# Patient Record
Sex: Female | Born: 1970 | Race: White | Hispanic: Yes | Marital: Married | Smoking: Never smoker
Health system: Southern US, Community
[De-identification: ages and names within clinical notes are randomized; demographics above are authoritative.]

## PROBLEM LIST (undated history)

## (undated) DIAGNOSIS — F419 Anxiety disorder, unspecified: Secondary | ICD-10-CM

## (undated) DIAGNOSIS — E7800 Pure hypercholesterolemia, unspecified: Secondary | ICD-10-CM

## (undated) DIAGNOSIS — R0602 Shortness of breath: Secondary | ICD-10-CM

## (undated) DIAGNOSIS — K219 Gastro-esophageal reflux disease without esophagitis: Secondary | ICD-10-CM

## (undated) DIAGNOSIS — D219 Benign neoplasm of connective and other soft tissue, unspecified: Secondary | ICD-10-CM

## (undated) DIAGNOSIS — J302 Other seasonal allergic rhinitis: Secondary | ICD-10-CM

## (undated) HISTORY — PX: TUBAL LIGATION: SHX77

## (undated) HISTORY — DX: Benign neoplasm of connective and other soft tissue, unspecified: D21.9

---

## 2009-07-07 ENCOUNTER — Ambulatory Visit: Payer: Self-pay | Admitting: Obstetrics and Gynecology

## 2009-07-07 LAB — CONVERTED CEMR LAB
Trich, Wet Prep: NONE SEEN
Yeast Wet Prep HPF POC: NONE SEEN

## 2009-07-09 ENCOUNTER — Ambulatory Visit (HOSPITAL_COMMUNITY): Admission: RE | Admit: 2009-07-09 | Discharge: 2009-07-09 | Payer: Self-pay | Admitting: Family Medicine

## 2009-08-18 ENCOUNTER — Ambulatory Visit: Payer: Self-pay | Admitting: Obstetrics & Gynecology

## 2009-09-24 ENCOUNTER — Ambulatory Visit (HOSPITAL_COMMUNITY): Admission: RE | Admit: 2009-09-24 | Discharge: 2009-09-24 | Payer: Self-pay | Admitting: Family Medicine

## 2011-03-04 LAB — POCT PREGNANCY, URINE: Preg Test, Ur: NEGATIVE

## 2011-04-11 NOTE — Group Therapy Note (Signed)
Lynn Robinson, Lynn Robinson NO.:  [id number]   MEDICAL RECORD NO.:  [id number]          PATIENT TYPE:  WOC   LOCATION:  [facility]                   FACILITY:  [facility]   PHYSICIAN:  Argentina Donovan, MD        DATE OF BIRTH:  01/20/1967   DATE OF SERVICE:                                  CLINIC NOTE   The patient is a 40 year old Spanish-speaking Hispanic female gravida 3,  para 3-0-0-3 with youngest child 75 years of age with a tubal ligation  following delivery of her last child.  She went in for a Pap smear at  [facility] and told them that she had had  some dark premenstrual spotting, abdominal bloating, and mid cycle, that  it radiated down and felt like her bottom was falling out, so she was  referred here and history taking she says that these symptoms have been  there for 16 years, although they have gotten worse in the last few  years.  She has increased pressure.  She will have a period, feel good  for few days and then the bloating starts and she has a terrible  bloating and pelvic pressure even when she sits down for about a week,  feels like everything is falling out.  She also has an increase in  discharge at that time.  On examination, the patient has a blood  pressure 105/69, her weight is 192, and she is 4 feet 9 inches tall.  The patient's abdomen is obese, soft, flat, and nontender.  No masses or  organomegaly, palpable.  The external genitalia is normal.  BUS was  within normal limits.  Vagina is clean and well rugated with a heavy  mucus discharge.  The cervix is slightly hypertrophied with a parous and  very slight erosion of the cervix at the introitus.  Pap smear done a  month ago was normal profoundly and bimanual pelvic examination, the  uterus is upper limits of normal.  Anterior of normal shape and  consistency.  The adnexa could not be well outlined, but there did not  appear to be any sign of any fullness and the  cul-de-sac was free.   IMPRESSION:  Midcycle bloating, probable hormonal in etiology, quite wet  prep was taken.  The patient will return for results in 2 weeks.           ______________________________  Argentina Donovan, MD     PR/MEDQ  D:  07/07/2009  T:  07/08/2009  Job:  [id number]

## 2011-05-14 ENCOUNTER — Other Ambulatory Visit: Payer: Self-pay | Admitting: Radiology

## 2011-10-11 ENCOUNTER — Encounter: Payer: Self-pay | Admitting: Obstetrics & Gynecology

## 2011-10-11 ENCOUNTER — Other Ambulatory Visit: Payer: Self-pay | Admitting: Obstetrics & Gynecology

## 2011-10-11 ENCOUNTER — Ambulatory Visit (INDEPENDENT_AMBULATORY_CARE_PROVIDER_SITE_OTHER): Payer: Self-pay | Admitting: Obstetrics & Gynecology

## 2011-10-11 VITALS — BP 140/87 | HR 77 | Temp 97.9°F | Ht 61.0 in | Wt 184.8 lb

## 2011-10-11 DIAGNOSIS — D259 Leiomyoma of uterus, unspecified: Secondary | ICD-10-CM

## 2011-10-11 DIAGNOSIS — N920 Excessive and frequent menstruation with regular cycle: Secondary | ICD-10-CM

## 2011-10-11 LAB — CBC
HCT: 36.7 % (ref 36.0–46.0)
MCH: 30.1 pg (ref 26.0–34.0)
MCV: 87 fL (ref 78.0–100.0)
Platelets: 292 10*3/uL (ref 150–400)
WBC: 9.5 10*3/uL (ref 4.0–10.5)

## 2011-10-11 LAB — POCT URINALYSIS DIP (DEVICE)
Bilirubin Urine: NEGATIVE
Glucose, UA: NEGATIVE mg/dL
Hgb urine dipstick: NEGATIVE
Specific Gravity, Urine: 1.025 (ref 1.005–1.030)
Urobilinogen, UA: 0.2 mg/dL (ref 0.0–1.0)
pH: 5.5 (ref 5.0–8.0)

## 2011-10-11 NOTE — Progress Notes (Signed)
Completed mammogram schloarship form with patient per patient request and sent to radiology

## 2011-10-11 NOTE — Progress Notes (Signed)
  Subjective:    Patient ID: Lynn Robinson, female    DOB: 01/20/1967, 40 y.o.   MRN: [id number]  HPI Patient's last menstrual period was 09/28/2011. She has a diagnosis of uterine fibroids since 2010. She has increasing pelvic pressure, pain, and feels a mass in her left lower quadrant. She potts for a few days before menses, then bleeds 3-4 days, sometimes heavily. Cramps are present.  Past Medical History  Diagnosis Date  . Fibroids    Past Surgical History  Procedure Date  . Tubal ligation    Scheduled Meds:   Continuous Infusions:   PRN Meds:.   No Known Allergies History   Social History  . Marital Status: Married    Spouse Name: N/A    Number of Children: N/A  . Years of Education: N/A   Occupational History  . Not on file.   Social History Main Topics  . Smoking status: Never Smoker   . Smokeless tobacco: Never Used  . Alcohol Use: No  . Drug Use: No  . Sexually Active: Yes    Birth Control/ Protection: Surgical   Other Topics Concern  . Not on file   Social History Narrative  . No narrative on file   Lynn Robinson does not currently have medications on file.    Review of Systems  Constitutional: Negative.   Respiratory: Negative.   Gastrointestinal: Negative.   Genitourinary: Positive for dysuria, urgency and pelvic pain.       Objective:   Physical Exam  Constitutional: She appears well-developed. No distress.  Neck: Neck supple.  Abdominal: Soft. Normal appearance. She exhibits mass. There is no tenderness.  Genitourinary: Vagina normal. No breast swelling, tenderness or bleeding. Uterus is enlarged. Uterus is not tender. Cervix exhibits no motion tenderness and no discharge. Right adnexum displays no mass. Left adnexum displays no mass.   Uterus firm, 12 weeks size       Assessment & Plan:  Fibroid, pressure, menorrhagia Pelvic US, UA, CBC, Pap done RTC 2 weeks

## 2011-10-11 NOTE — Patient Instructions (Signed)
Fibromas (Fibroids) Le han diagnosticado un fibroma. Los fibromas son tumores (bultos) de tejido muscular blando que pueden aparecer en cualquiel lugar del organismo de [place]. Generalmente se desarrollan en el tero (matriz). El sntoma (problema) ms comn de los fibromas es la hemorragia. Con el tiempo puede causar anemia (poca cantidad de glbulos rojos). Otros sntomas incluyen sensaciones de opresin y dolor en la pelvis. El diagnstico (determinar cul es el problema) se realiza a travs del examen fsico. A veces se utilizan pruebas como el [place]. Este examen es de gran utilidad cuando los fibromas se localizan alrededor de los ovarios y tambin para Engineer, manufacturing tumores. TRATAMIENTO  La mayor parte de los fibromas no necesitan tratamiento mdico o quirrgico. A veces es necesario realizar una biopsia (tomar muestras de tejido) de las paredes del tero para Clinical cytogeneticist. Si se ha descartado cncer y slo hay una pequea cantidad de hemorragia, el problema debe mantenerse bajo observacin.   Un tratamiento hormonal puede mejorar el problema.   Cuando se hace necesario implementar una ciruga, sta consistir en la extirpacin del fibroma. Despus de la extirpacin de fibromas, no ser posible tener un parto por va vaginal. Todo depende de la ubicacin y la extensin de la [place]. Cuando se produce Chartered loss adjuster en presencia de un fibroma, generalmente es normal.   El profesional que lo asiste ayudar a Chief Strategy Officer que tratamiento es el mejor para usted.  INSTRUCCIONES PARA EL CUIDADO DOMICILIARIO  Utilice los medicamentos de venta libre o de prescripcin para Chief Technology Officer, el malestar o la [place], segn se lo indique el profesional que lo asiste. No utilice aspirina, ya que puede Metallurgist.   Si las menstruaciones (perodos) son muy abundantes, registre el nmero de apsitos o tampones que utiliza por mes. Lleve esta informacin al profesional que la asiste. Esto puede ayudar  a Leisure centre manager tratamiento para usted.  SOLICITE ATENCIN MDICA DE INMEDIATO SI:  Siente dolor o calambres en la pelvis que no puede controlar con los medicamentos, o experimenta un repentino aumento del Engineer, mining.   La hemorragia plvica [employer] o durante el transcurso de las mismas.   Si se siente mareada o se desmaya.   Presenta dolor abdominal (en el vientre) que se hace cada vez ms intenso.  Document Released: 11/13/2005 Document Revised: 07/26/2011 [facility] Patient Information 2012 [place], [place].

## 2011-10-13 ENCOUNTER — Ambulatory Visit (HOSPITAL_COMMUNITY)
Admission: RE | Admit: 2011-10-13 | Discharge: 2011-10-13 | Disposition: A | Discharge status: Home / Self Care | Payer: Self-pay | Source: Ambulatory Visit | Attending: Obstetrics & Gynecology | Admitting: Obstetrics & Gynecology

## 2011-10-13 DIAGNOSIS — N852 Hypertrophy of uterus: Secondary | ICD-10-CM | POA: Insufficient documentation

## 2011-10-13 DIAGNOSIS — D259 Leiomyoma of uterus, unspecified: Secondary | ICD-10-CM

## 2011-10-13 DIAGNOSIS — N920 Excessive and frequent menstruation with regular cycle: Secondary | ICD-10-CM

## 2011-10-13 DIAGNOSIS — N949 Unspecified condition associated with female genital organs and menstrual cycle: Secondary | ICD-10-CM | POA: Insufficient documentation

## 2011-10-16 ENCOUNTER — Other Ambulatory Visit: Payer: Self-pay | Admitting: Obstetrics & Gynecology

## 2011-10-16 DIAGNOSIS — N63 Unspecified lump in unspecified breast: Secondary | ICD-10-CM

## 2011-11-15 ENCOUNTER — Ambulatory Visit (INDEPENDENT_AMBULATORY_CARE_PROVIDER_SITE_OTHER): Payer: Self-pay | Admitting: Obstetrics and Gynecology

## 2011-11-15 ENCOUNTER — Encounter: Payer: Self-pay | Admitting: Obstetrics and Gynecology

## 2011-11-15 DIAGNOSIS — N920 Excessive and frequent menstruation with regular cycle: Secondary | ICD-10-CM

## 2011-11-15 DIAGNOSIS — D259 Leiomyoma of uterus, unspecified: Secondary | ICD-10-CM

## 2011-11-15 MED ORDER — OXYCODONE-ACETAMINOPHEN 5-325 MG PO TABS: 1.0000 | ORAL_TABLET | ORAL | Status: DC | PRN

## 2011-11-15 NOTE — Progress Notes (Signed)
Patient presents today to discuss the results of the ultrasound and for further management of her fibroids. Patient reports regular monthly menses with heavy flow but her most bothersome symptoms are that of pelvic pain. Patient reports that it is hard for her to find a comfortable position to sleep secondary to the pain. Patient with 16-18 week size uterus on exam and was counseled on TAH. Risk, benefits and alternatives were explained including but not limited to risk of bleeding, infection and damage to adjacent organs. Patient verbalized understanding and will be contacted by surgical scheduler with date and time of the surgery.

## 2011-11-15 NOTE — Progress Notes (Signed)
Pt. States had flu shot this year at [facility] [facility]

## 2011-11-17 ENCOUNTER — Encounter (HOSPITAL_COMMUNITY): Payer: Self-pay | Admitting: Pharmacist

## 2011-11-24 ENCOUNTER — Telehealth: Payer: Self-pay | Admitting: *Deleted

## 2011-11-24 NOTE — Telephone Encounter (Signed)
I called the Patient Accounting Dept. about the pt's financial aid application status as requested last week by pt and her daughter. After learning of the current status, I called and  spoke with pt's daughter [name]) earlier today in reference to pt's financial aid application. I explained that there is some additional information needed.  It is necessary to have a recent bank statement from all of the people in the household that are working. This information will complete the application so that it can be processed. [name] stated that she will bring the information by 11/29/11. I informed [name] that since her mother is scheduled for surgery on 12/06/11, it is very important to get this information quickly. She voiced understanding. I notified Karna Christmas @ the pt accounting dept that I had spoken with Ms [name] family member. She will forward the application to the appropriate personnel once the statements have been received.

## 2011-11-30 ENCOUNTER — Encounter (HOSPITAL_COMMUNITY): Payer: Self-pay

## 2011-11-30 ENCOUNTER — Encounter (HOSPITAL_COMMUNITY)
Admission: RE | Admit: 2011-11-30 | Discharge: 2011-11-30 | Disposition: A | Discharge status: Home / Self Care | Payer: Self-pay | Source: Ambulatory Visit | Attending: Obstetrics and Gynecology | Admitting: Obstetrics and Gynecology

## 2011-11-30 HISTORY — DX: Anxiety disorder, unspecified: F41.9

## 2011-11-30 HISTORY — DX: Other seasonal allergic rhinitis: J30.2

## 2011-11-30 HISTORY — DX: Gastro-esophageal reflux disease without esophagitis: K21.9

## 2011-11-30 HISTORY — DX: Shortness of breath: R06.02

## 2011-11-30 LAB — SURGICAL PCR SCREEN: Staphylococcus aureus: NEGATIVE

## 2011-11-30 LAB — CBC: MCV: 88 fL (ref 78.0–100.0)

## 2011-11-30 NOTE — Patient Instructions (Addendum)
   Your procedure is scheduled on:  Wednesday January 9th  Enter through the [employer] of [facility] at: 11:30am Pick up the phone at the desk and dial [phone] and inform us of your arrival.  Please call this number if you have any problems the morning of surgery: [phone]  Remember: Do not eat food after midnight: Tuesday Do not drink clear liquids after:Wednesday at 9am Take these medicines the morning of surgery with a SIP OF WATER: none  Do not wear jewelry, make-up, or FINGER nail polish Do not wear lotions, powders, perfumes or deodorant. Do not shave 48 hours prior to surgery. Do not bring valuables to the hospital.  Leave suitcase in the car. After Surgery it may be brought to your room. For patients being admitted to the hospital, checkout time is 11:00am the day of discharge.  Patients discharged on the day of surgery will not be allowed to drive home.     Remember to use your hibiclens as instructed.Please shower with 1/2 bottle the evening before your surgery and the other 1/2 bottle the morning of surgery.

## 2011-11-30 NOTE — Pre-Procedure Instructions (Signed)
Hospital interpreter assisted with interview

## 2011-12-06 ENCOUNTER — Encounter (HOSPITAL_COMMUNITY)
Admission: RE | Disposition: A | Discharge status: Home / Self Care | Payer: Self-pay | Source: Ambulatory Visit | Attending: Obstetrics and Gynecology

## 2011-12-06 ENCOUNTER — Inpatient Hospital Stay (HOSPITAL_COMMUNITY): Payer: Self-pay | Admitting: Anesthesiology

## 2011-12-06 ENCOUNTER — Encounter (HOSPITAL_COMMUNITY): Payer: Self-pay | Admitting: Anesthesiology

## 2011-12-06 ENCOUNTER — Inpatient Hospital Stay (HOSPITAL_COMMUNITY)
Admission: RE | Admit: 2011-12-06 | Discharge: 2011-12-08 | DRG: 743 | Disposition: A | Discharge status: Home / Self Care | Payer: Self-pay | Source: Ambulatory Visit | Attending: Obstetrics and Gynecology | Admitting: Obstetrics and Gynecology

## 2011-12-06 ENCOUNTER — Other Ambulatory Visit: Payer: Self-pay | Admitting: Obstetrics and Gynecology

## 2011-12-06 ENCOUNTER — Encounter (HOSPITAL_COMMUNITY): Payer: Self-pay | Admitting: *Deleted

## 2011-12-06 DIAGNOSIS — N938 Other specified abnormal uterine and vaginal bleeding: Secondary | ICD-10-CM

## 2011-12-06 DIAGNOSIS — N8000 Endometriosis of the uterus, unspecified: Secondary | ICD-10-CM | POA: Diagnosis present

## 2011-12-06 DIAGNOSIS — N949 Unspecified condition associated with female genital organs and menstrual cycle: Secondary | ICD-10-CM | POA: Diagnosis present

## 2011-12-06 DIAGNOSIS — D259 Leiomyoma of uterus, unspecified: Secondary | ICD-10-CM

## 2011-12-06 DIAGNOSIS — D251 Intramural leiomyoma of uterus: Principal | ICD-10-CM | POA: Diagnosis present

## 2011-12-06 DIAGNOSIS — R1032 Left lower quadrant pain: Secondary | ICD-10-CM | POA: Diagnosis present

## 2011-12-06 DIAGNOSIS — N920 Excessive and frequent menstruation with regular cycle: Secondary | ICD-10-CM

## 2011-12-06 HISTORY — PX: CYSTOSCOPY: SHX5120

## 2011-12-06 HISTORY — PX: ABDOMINAL HYSTERECTOMY: SHX81

## 2011-12-06 SURGERY — HYSTERECTOMY, ABDOMINAL
Anesthesia: General | Site: Bladder | Wound class: Clean Contaminated

## 2011-12-06 MED ORDER — CEFAZOLIN SODIUM 1-5 GM-% IV SOLN: 1.0000 g | INTRAVENOUS | Status: DC

## 2011-12-06 MED ORDER — PROPOFOL 10 MG/ML IV EMUL
INTRAVENOUS | Status: DC | PRN
  Administered 2011-12-06: 200 mg via INTRAVENOUS

## 2011-12-06 MED ORDER — ONDANSETRON HCL 4 MG/2ML IJ SOLN
INTRAMUSCULAR | Status: AC
  Filled 2011-12-06: qty 2

## 2011-12-06 MED ORDER — MIDAZOLAM HCL 2 MG/2ML IJ SOLN
INTRAMUSCULAR | Status: AC
  Filled 2011-12-06: qty 2

## 2011-12-06 MED ORDER — CEFAZOLIN SODIUM 1-5 GM-% IV SOLN
INTRAVENOUS | Status: AC
  Administered 2011-12-06: 1 g via INTRAVENOUS
  Filled 2011-12-06: qty 50

## 2011-12-06 MED ORDER — DIPHENHYDRAMINE HCL 50 MG/ML IJ SOLN: 12.5000 mg | Freq: Four times a day (QID) | INTRAMUSCULAR | Status: DC | PRN

## 2011-12-06 MED ORDER — ROCURONIUM BROMIDE 50 MG/5ML IV SOLN
INTRAVENOUS | Status: AC
  Filled 2011-12-06: qty 1

## 2011-12-06 MED ORDER — LIDOCAINE HCL (CARDIAC) 20 MG/ML IV SOLN
INTRAVENOUS | Status: AC
  Filled 2011-12-06: qty 5

## 2011-12-06 MED ORDER — 0.9 % SODIUM CHLORIDE (POUR BTL) OPTIME
TOPICAL | Status: DC | PRN
  Administered 2011-12-06: 1000 mL

## 2011-12-06 MED ORDER — HYDROMORPHONE 0.3 MG/ML IV SOLN
INTRAVENOUS | Status: AC
  Filled 2011-12-06: qty 25

## 2011-12-06 MED ORDER — SODIUM CHLORIDE 0.9 % IJ SOLN: 9.0000 mL | INTRAMUSCULAR | Status: DC | PRN

## 2011-12-06 MED ORDER — FENTANYL CITRATE 0.05 MG/ML IJ SOLN
INTRAMUSCULAR | Status: AC
  Filled 2011-12-06: qty 5

## 2011-12-06 MED ORDER — KETOROLAC TROMETHAMINE 30 MG/ML IJ SOLN
INTRAMUSCULAR | Status: AC
  Filled 2011-12-06: qty 1

## 2011-12-06 MED ORDER — FENTANYL CITRATE 0.05 MG/ML IJ SOLN
INTRAMUSCULAR | Status: DC | PRN
  Administered 2011-12-06 (×4): 50 ug via INTRAVENOUS
  Administered 2011-12-06: 100 ug via INTRAVENOUS

## 2011-12-06 MED ORDER — LACTATED RINGERS IV SOLN
INTRAVENOUS | Status: DC
  Administered 2011-12-06: 125 mL/h via INTRAVENOUS
  Administered 2011-12-06 (×2): via INTRAVENOUS

## 2011-12-06 MED ORDER — LACTATED RINGERS IV SOLN
INTRAVENOUS | Status: DC
  Administered 2011-12-06 – 2011-12-07 (×3): via INTRAVENOUS

## 2011-12-06 MED ORDER — ESMOLOL HCL 10 MG/ML IV SOLN
INTRAVENOUS | Status: AC
  Filled 2011-12-06: qty 10

## 2011-12-06 MED ORDER — GLYCOPYRROLATE 0.2 MG/ML IJ SOLN
INTRAMUSCULAR | Status: AC
  Filled 2011-12-06: qty 1

## 2011-12-06 MED ORDER — NALOXONE HCL 0.4 MG/ML IJ SOLN: 0.4000 mg | INTRAMUSCULAR | Status: DC | PRN

## 2011-12-06 MED ORDER — GLYCOPYRROLATE 0.2 MG/ML IJ SOLN
INTRAMUSCULAR | Status: DC | PRN
  Administered 2011-12-06: .6 mg via INTRAVENOUS

## 2011-12-06 MED ORDER — ONDANSETRON HCL 4 MG/2ML IJ SOLN: 4.0000 mg | Freq: Four times a day (QID) | INTRAMUSCULAR | Status: DC | PRN

## 2011-12-06 MED ORDER — DEXAMETHASONE SODIUM PHOSPHATE 10 MG/ML IJ SOLN
INTRAMUSCULAR | Status: AC
  Filled 2011-12-06: qty 1

## 2011-12-06 MED ORDER — LIDOCAINE HCL (CARDIAC) 20 MG/ML IV SOLN
INTRAVENOUS | Status: DC | PRN
  Administered 2011-12-06: 100 mg via INTRAVENOUS

## 2011-12-06 MED ORDER — INDIGOTINDISULFONATE SODIUM 8 MG/ML IJ SOLN
INTRAMUSCULAR | Status: AC
  Filled 2011-12-06: qty 5

## 2011-12-06 MED ORDER — ONDANSETRON HCL 4 MG/2ML IJ SOLN
INTRAMUSCULAR | Status: DC | PRN
  Administered 2011-12-06: 4 mg via INTRAVENOUS

## 2011-12-06 MED ORDER — NEOSTIGMINE METHYLSULFATE 1 MG/ML IJ SOLN
INTRAMUSCULAR | Status: DC | PRN
  Administered 2011-12-06: 3 mg via INTRAVENOUS

## 2011-12-06 MED ORDER — ROCURONIUM BROMIDE 100 MG/10ML IV SOLN
INTRAVENOUS | Status: DC | PRN
  Administered 2011-12-06: 50 mg via INTRAVENOUS

## 2011-12-06 MED ORDER — PROMETHAZINE HCL 25 MG/ML IJ SOLN: 12.5000 mg | INTRAMUSCULAR | Status: DC | PRN

## 2011-12-06 MED ORDER — PROPOFOL 10 MG/ML IV EMUL
INTRAVENOUS | Status: AC
  Filled 2011-12-06: qty 20

## 2011-12-06 MED ORDER — HYDROMORPHONE 0.3 MG/ML IV SOLN
INTRAVENOUS | Status: DC
  Administered 2011-12-06: 1.8 mg via INTRAVENOUS
  Administered 2011-12-06: 17:00:00 via INTRAVENOUS
  Administered 2011-12-07: 0.6 mg via INTRAVENOUS
  Administered 2011-12-07: 0.9 mg via INTRAVENOUS

## 2011-12-06 MED ORDER — HYDROMORPHONE HCL PF 1 MG/ML IJ SOLN
INTRAMUSCULAR | Status: AC
  Administered 2011-12-06: 0.5 mg via INTRAVENOUS
  Filled 2011-12-06: qty 1

## 2011-12-06 MED ORDER — ESMOLOL HCL 10 MG/ML IV SOLN
INTRAVENOUS | Status: DC | PRN
  Administered 2011-12-06: 10 mg via INTRAVENOUS

## 2011-12-06 MED ORDER — KETOROLAC TROMETHAMINE 30 MG/ML IJ SOLN
INTRAMUSCULAR | Status: DC | PRN
  Administered 2011-12-06: 30 mg via INTRAVENOUS

## 2011-12-06 MED ORDER — METOCLOPRAMIDE HCL 5 MG/ML IJ SOLN: 10.0000 mg | Freq: Once | INTRAMUSCULAR | Status: DC | PRN

## 2011-12-06 MED ORDER — DEXAMETHASONE SODIUM PHOSPHATE 4 MG/ML IJ SOLN
INTRAMUSCULAR | Status: DC | PRN
  Administered 2011-12-06: 10 mg via INTRAVENOUS

## 2011-12-06 MED ORDER — HYDROMORPHONE HCL PF 1 MG/ML IJ SOLN
0.2500 mg | INTRAMUSCULAR | Status: DC | PRN
  Administered 2011-12-06: 0.5 mg via INTRAVENOUS

## 2011-12-06 MED ORDER — LACTATED RINGERS IV SOLN: INTRAVENOUS | Status: DC

## 2011-12-06 MED ORDER — DIPHENHYDRAMINE HCL 12.5 MG/5ML PO ELIX: 12.5000 mg | ORAL_SOLUTION | Freq: Four times a day (QID) | ORAL | Status: DC | PRN

## 2011-12-06 MED ORDER — NEOSTIGMINE METHYLSULFATE 1 MG/ML IJ SOLN
INTRAMUSCULAR | Status: AC
  Filled 2011-12-06: qty 10

## 2011-12-06 MED ORDER — FENTANYL CITRATE 0.05 MG/ML IJ SOLN
INTRAMUSCULAR | Status: AC
  Filled 2011-12-06: qty 2

## 2011-12-06 MED ORDER — INDIGOTINDISULFONATE SODIUM 8 MG/ML IJ SOLN
INTRAMUSCULAR | Status: DC | PRN
  Administered 2011-12-06: 40 mg via INTRAVENOUS

## 2011-12-06 MED ORDER — MIDAZOLAM HCL 5 MG/5ML IJ SOLN
INTRAMUSCULAR | Status: DC | PRN
  Administered 2011-12-06 (×2): 1 mg via INTRAVENOUS

## 2011-12-06 SURGICAL SUPPLY — 32 items
CANISTER SUCTION 2500CC (MISCELLANEOUS) ×3 IMPLANT
CHLORAPREP W/TINT 26ML (MISCELLANEOUS) ×3 IMPLANT
CLOTH BEACON ORANGE TIMEOUT ST (SAFETY) ×3 IMPLANT
CONT PATH 16OZ SNAP LID 3702 (MISCELLANEOUS) ×3 IMPLANT
DECANTER SPIKE VIAL GLASS SM (MISCELLANEOUS) IMPLANT
DRESSING TELFA 8X3 (GAUZE/BANDAGES/DRESSINGS) ×3 IMPLANT
DRSG PAD ABDOMINAL 8X10 ST (GAUZE/BANDAGES/DRESSINGS) ×3 IMPLANT
GAUZE SPONGE 4X4 12PLY STRL LF (GAUZE/BANDAGES/DRESSINGS) ×3 IMPLANT
GLOVE BIOGEL PI IND STRL 6.5 (GLOVE) ×4 IMPLANT
GLOVE BIOGEL PI INDICATOR 6.5 (GLOVE) ×2
GLOVE SURG SS PI 6.0 STRL IVOR (GLOVE) ×3 IMPLANT
GOWN PREVENTION PLUS LG XLONG (DISPOSABLE) ×9 IMPLANT
NEEDLE HYPO 25X1 1.5 SAFETY (NEEDLE) IMPLANT
NS IRRIG 1000ML POUR BTL (IV SOLUTION) ×3 IMPLANT
PACK ABDOMINAL GYN (CUSTOM PROCEDURE TRAY) ×3 IMPLANT
PAD OB MATERNITY 4.3X12.25 (PERSONAL CARE ITEMS) ×3 IMPLANT
SEPRAFILM MEMBRANE 5X6 (MISCELLANEOUS) IMPLANT
SET CYSTO W/LG BORE CLAMP LF (SET/KITS/TRAYS/PACK) ×3 IMPLANT
SPONGE LAP 18X18 X RAY DECT (DISPOSABLE) ×3 IMPLANT
STAPLER VISISTAT 35W (STAPLE) ×3 IMPLANT
SUT PDS AB 1 CT1 36 (SUTURE) IMPLANT
SUT VIC AB 0 CT1 18XCR BRD8 (SUTURE) ×4 IMPLANT
SUT VIC AB 0 CT1 36 (SUTURE) ×6 IMPLANT
SUT VIC AB 0 CT1 8-18 (SUTURE) ×2
SUT VIC AB 3-0 FS2 27 (SUTURE) ×3 IMPLANT
SUT VICRYL 0 TIES 12 18 (SUTURE) ×3 IMPLANT
SYR CONTROL 10ML LL (SYRINGE) IMPLANT
TAPE CLOTH SURG 4X10 WHT LF (GAUZE/BANDAGES/DRESSINGS) ×3 IMPLANT
TOWEL OR 17X24 6PK STRL BLUE (TOWEL DISPOSABLE) ×6 IMPLANT
TRAY FOLEY CATH 14FR (SET/KITS/TRAYS/PACK) ×3 IMPLANT
WATER STERILE IRR 1000ML POUR (IV SOLUTION) IMPLANT
WATER STERILE IRR 1000ML UROMA (IV SOLUTION) ×3 IMPLANT

## 2011-12-06 NOTE — Anesthesia Preprocedure Evaluation (Signed)
Anesthesia Evaluation  Patient identified by MRN, date of birth, ID band Patient awake    Reviewed: Allergy & Precautions, H&P , NPO status , Patient's Chart, lab work & pertinent test results, reviewed documented beta blocker date and time   Airway Mallampati: II TM Distance: >3 FB Neck ROM: full    Dental No notable dental hx. (+) Teeth Intact   Pulmonary    Pulmonary exam normal       Cardiovascular neg cardio ROS     Neuro/Psych Negative Neurological ROS  Negative Psych ROS   GI/Hepatic Neg liver ROS, GERD-  ,  Endo/Other  Morbid obesity  Renal/GU negative Renal ROS  Genitourinary negative   Musculoskeletal negative musculoskeletal ROS (+)   Abdominal (+) obese,   Peds negative pediatric ROS (+)  Hematology negative hematology ROS (+)   Anesthesia Other Findings   Reproductive/Obstetrics negative OB ROS                           Anesthesia Physical Anesthesia Plan  ASA: III  Anesthesia Plan: General   Post-op Pain Management:    Induction: Intravenous  Airway Management Planned: Oral ETT  Additional Equipment:   Intra-op Plan:   Post-operative Plan: Extubation in OR  Informed Consent: I have reviewed the patients History and Physical, chart, labs and discussed the procedure including the risks, benefits and alternatives for the proposed anesthesia with the patient or authorized representative who has indicated his/her understanding and acceptance.   Dental Advisory Given  Plan Discussed with: CRNA  Anesthesia Plan Comments:         Anesthesia Quick Evaluation

## 2011-12-06 NOTE — Anesthesia Postprocedure Evaluation (Signed)
Anesthesia Post Note  Patient: Lynn Robinson  Procedure(s) Performed:  HYSTERECTOMY ABDOMINAL; CYSTOSCOPY  Anesthesia type: General  Patient location: PACU  Post pain: Pain level controlled  Post assessment: Post-op Vital signs reviewed  Last Vitals:  Filed Vitals:   12/06/11 1443  BP: 124/63  Pulse: 92  Temp: 37.2 C  Resp: 16    Post vital signs: Reviewed  Level of consciousness: sedated  Complications: No apparent anesthesia complicationsfj

## 2011-12-06 NOTE — Anesthesia Procedure Notes (Signed)
Procedure Name: Intubation Date/Time: 12/06/2011 1:05 PM Performed by: Truitt Leep Pre-anesthesia Checklist: Patient identified, Emergency Drugs available, Suction available, Patient being monitored and Timeout performed Patient Re-evaluated:Patient Re-evaluated prior to inductionOxygen Delivery Method: Circle System Utilized Preoxygenation: Pre-oxygenation with 100% oxygen Intubation Type: IV induction Ventilation: Mask ventilation without difficulty Laryngoscope Size: Mac and 4 Grade View: Grade I Tube type: Oral Number of attempts: 1 Placement Confirmation: ETT inserted through vocal cords under direct vision,  positive ETCO2 and breath sounds checked- equal and bilateral Secured at: 22 cm Dental Injury: Teeth and Oropharynx as per pre-operative assessment

## 2011-12-06 NOTE — Transfer of Care (Signed)
Immediate Anesthesia Transfer of Care Note  Patient: Lynn Robinson  Procedure(s) Performed:  HYSTERECTOMY ABDOMINAL; CYSTOSCOPY  Patient Location: PACU  Anesthesia Type: General  Level of Consciousness: awake and sedated  Airway & Oxygen Therapy: Patient connected to nasal cannula oxygen  Post-op Assessment: Post -op Vital signs reviewed and stable  Post vital signs: Reviewed and stable  Complications: No apparent anesthesia complications

## 2011-12-06 NOTE — H&P (Addendum)
Lynn Robinson is an 41 y.o. female G3P3 presenting today for scheduled hysterectomy for treatment of fibroid uterus. Patient has been diagnosed with fibroid uterus since 2010 and has experienced monthly menses lasting 4 days with heavy flow. Her most bothersome symptom is that of pelvic pressure mainly localized in her LLQ.   Pertinent Gynecological History: Bleeding: regular Contraception: tubal ligation DES exposure: denies Blood transfusions: none Sexually transmitted diseases: no past history Previous GYN Procedures: n/a  Last mammogram: normal Date: 2010 Last pap: normal Date: 09/2011 OB History: G3, P3   Menstrual History:  Patient's last menstrual period was 10/21/2011.    Past Medical History  Diagnosis Date  . Fibroids   . Seasonal allergies   . Shortness of breath     on exertion only  . Anxiety   . GERD (gastroesophageal reflux disease)     occasional reflux-no meds    Past Surgical History  Procedure Date  . Tubal ligation     Family History  Problem Relation Age of Onset  . Cancer Mother 31    breast    Social History:  reports that she has never smoked. She has never used smokeless tobacco. She reports that she does not drink alcohol or use illicit drugs.  Allergies: No Known Allergies  Prescriptions prior to admission  Medication Sig Dispense Refill  . loratadine (CLARITIN) 10 MG tablet Take 10 mg by mouth daily as needed. For allergies         Review of Systems  All other systems reviewed and are negative.    Blood pressure 122/65, pulse 77, temperature 98.4 F (36.9 C), temperature source Oral, resp. rate 16, height 5' (1.524 m), weight 83.915 kg (185 lb), last menstrual period 10/21/2011, SpO2 100.00%. Physical Exam GENERAL: Well-developed, well-nourished female in no acute distress.  HEENT: Normocephalic, atraumatic. Sclerae anicteric.  NECK: Supple. Normal thyroid.  LUNGS: Clear to auscultation bilaterally.  HEART: Regular rate and  rhythm. ABDOMEN: Soft, nontender, nondistended. No organomegaly. PELVIC: Normal external female genitalia. Vagina is pink and rugated.  Normal discharge. Normal appearing cervix. Uterus is 14-16 week in size filling the posterior cul de sac. No adnexal mass or tenderness. EXTREMITIES: No cyanosis, clubbing, or edema, 2+ distal pulses.  Results for orders placed during the hospital encounter of 12/06/11 (from the past 24 hour(s))  PREGNANCY, URINE     Status: Normal   Collection Time   12/06/11 11:36 AM      Component Value Range   Preg Test, Ur NEGATIVE      No results found.  Assessment/Plan: 41 yo G3P3 presented today for scheduled hysterectomy secondary to pelvic pain/pressure caused by fibroid uterus. - Risk, benefits and alternatives explained including but not limited to risk of bleeding, infection and damage to adjacent organs - Patient verbalized understanding. All questions were answered.  - Patient understands that there is a great likelihood that her pelvic pressure will be resolved following this procedure.  Maddison Kilner 12/06/2011, 12:28 PM

## 2011-12-06 NOTE — Op Note (Signed)
Lynn Robinson PROCEDURE DATE: 12/06/2011  PREOPERATIVE DIAGNOSIS:  41 y.o. G3P3003 with symptomatic fibroids POSTOPERATIVE DIAGNOSIS:  Same SURGEON:   Catalina Antigua, M.D. ASSISTANTVela Prose Anyanwu OPERATION:  Total abdominal hysterectomy, cystoscopy ANESTHESIA:  General endotracheal.  INDICATIONS: The patient is a 41 y.o. [id number] with history of symptomatic uterine fibroids. The patient made a decision to undergo definite surgical treatment. On the preoperative visit, the risks, benefits, indications, and alternatives of the procedure were reviewed with the patient.  On the day of surgery, the risks of surgery were again discussed with the patient including but not limited to: bleeding which may require transfusion or reoperation; infection which may require antibiotics; injury to bowel, bladder, ureters or other surrounding organs; need for additional procedures; thromboembolic phenomenon, incisional problems and other postoperative/anesthesia complications. Written informed consent was obtained.    OPERATIVE FINDINGS: A 16 week size uterus with normal tubes and ovaries bilaterally.  ESTIMATED BLOOD LOSS: 400 ml FLUIDS:  2000 ml of Lactated Ringers URINE OUTPUT:  200 ml of clear yellow urine. SPECIMENS:  Uterus sent to pathology COMPLICATIONS:  None immediate.   DESCRIPTION OF PROCEDURE: The patient received intravenous antibiotics and had sequential compression devices applied to her lower extremities while in the preoperative area.   She was taken to the operating room where anesthesia was induced and found to be adequate. The patient was placed in supine position. The abdomen and perineum were prepped and draped in a sterile manner, and a Foley catheter was inserted into the bladder and attached to Lynn Robinson drainage. After an adequate timeout was performed, a Pfannensteil skin incision was made. This incision was taken down to the fascia using electrocautery with care given to maintain good  hemostasis. The fascia was incised in the midline and the fascial incision was then extended bilaterally using mayo scissors. The fascia was then dissected off the underlying rectus muscles using blunt and sharp dissection. The rectus muscles were split bluntly in the midline and the peritoneum entered sharply without complication. This peritoneal incision was then extended superiorly and inferiorly with care given to prevent bowel or bladder injury. Upon entry into the abdominal cavity, the upper abdomen was inspected and found to be normal. Attention was then turned to the pelvis. A retractor was placed into the incision, and the bowel was packed away with moist laparotomy sponges. The uterus at this point was noted to be mobilized. The round ligaments on each side were clamped, suture ligated, and transected with electrocautery allowing entry into the broad ligament. The anterior and posterior leaves of the broad ligament were separated and the ureters were inspected to be safely away from the area of dissection bilaterally. A hole was created in the clear portion of the posterior broad ligament. Adnexae were clamped on the patient's right side. This pedicle was then clamped, cut, and doubly suture ligated with good hemostasis. This procedure was repeated in an identical fashion on the left site allowing for both adnexa to remain in place.   A bladder flap was then created across the anterior leaf of the broad ligament and the bladder was then bluntly dissected off the lower uterine segment and cervix with good hemostasis. The uterine arteries were then skeletonized bilaterally and then clamped, cut, and ligated with care given to prevent ureteral injury. The uterosacral ligaments were then clamped, cut, and ligated bilaterally. Finally, the cardinal ligaments were clamped, cut, and ligated bilaterally.  Acutely curved clamps were placed across the vagina just under the cervix, and  the specimen was amputated and  sent to pathology. The vaginal cuff was closed with a series of interrupted 0 Vicryl figure-of-eight sutures with care given to incorporate the anterior pubocervical fascia and the posterior rectovaginal fascia.  The vaginal cuff angles were noted to have good hemostasis.  The pelvis was irrigated and hemostasis was reconfirmed at all pedicles and along the pelvic sidewall.  All laparotomy sponges and instruments were removed from the abdomen. To ensure the integrity of the ureters was maintained, a cystoscopy was then performed after administration of indigo carmine. Bilateral ureteral jets were visualized. The fascia was closed with 0 Vicryl in a running fashion. The subcutaneous layer was reapproximated with 2-0 plain gut. The skin was closed with a 4-0 Vicryl subcuticular stitch. Sponge, lap, needle, and instrument counts were correct times two. The patient was taken to the recovery area awake, extubated and in stable condition.

## 2011-12-07 ENCOUNTER — Encounter (HOSPITAL_COMMUNITY): Payer: Self-pay | Admitting: Obstetrics and Gynecology

## 2011-12-07 LAB — CBC
HCT: 30.4 % — ABNORMAL LOW (ref 36.0–46.0)
MCH: 30.4 pg (ref 26.0–34.0)
MCHC: 34.9 g/dL (ref 30.0–36.0)
RDW: 12.8 % (ref 11.5–15.5)

## 2011-12-07 MED ORDER — OXYCODONE-ACETAMINOPHEN 5-325 MG PO TABS
1.0000 | ORAL_TABLET | ORAL | Status: DC | PRN
Start: 2011-12-07 — End: 2011-12-08
  Administered 2011-12-07 – 2011-12-08 (×2): 2 via ORAL
  Administered 2011-12-08: 1 via ORAL
  Filled 2011-12-07 (×3): qty 2

## 2011-12-07 MED ORDER — IBUPROFEN 600 MG PO TABS: 600.0000 mg | ORAL_TABLET | Freq: Four times a day (QID) | ORAL | Status: DC | PRN

## 2011-12-07 NOTE — Anesthesia Postprocedure Evaluation (Signed)
  Anesthesia Post-op Note  Patient: Lynn Robinson  Procedure(s) Performed:  HYSTERECTOMY ABDOMINAL; CYSTOSCOPY  Patient Location: Women's Unit  Anesthesia Type: General  Level of Consciousness: awake, alert  and oriented  Airway and Oxygen Therapy: Patient Spontanous Breathing  Post-op Pain: none  Post-op Assessment: Post-op Vital signs reviewed and Patient's Cardiovascular Status Stable  Post-op Vital Signs: Reviewed and stable  Complications: No apparent anesthesia complications

## 2011-12-07 NOTE — Progress Notes (Signed)
1 Day Post-Op Procedure(s): HYSTERECTOMY ABDOMINAL CYSTOSCOPY  Subjective: Patient reports incisional pain and tolerating PO.  Was able to ambulate with assistance  Objective: I have reviewed patient's vital signs, intake and output and labs.  General: alert, cooperative and no distress Resp: clear to auscultation bilaterally Cardio: regular rate and rhythm GI: soft, non-tender; bowel sounds normal; no masses,  no organomegaly and incision: clean, dry, intact and no erythema, induration or drainage Extremities: extremities normal, atraumatic, no cyanosis or edema, Homans sign is negative, no sign of DVT and no edema, redness or tenderness in the calves or thighs  Assessment: s/p Procedure(s): HYSTERECTOMY ABDOMINAL CYSTOSCOPY: progressing well  Plan: Advance diet Encourage ambulation Advance to PO medication Discontinue IV fluids  LOS: 1 day    Lynn Robinson 12/07/2011, 8:32 AM

## 2011-12-07 NOTE — Addendum Note (Signed)
Addendum  created 12/07/11 0957 by Madison Hickman   Modules edited:Notes Section

## 2011-12-07 NOTE — Progress Notes (Signed)
[facility] Department of Clinical Social Work Documentation of Interpretation   I assisted _Dr. Constatnt__________________ with interpretation of __plan of care____________________ for this patient.

## 2011-12-07 NOTE — Progress Notes (Signed)
[facility] Department of Clinical Social Work Documentation of Interpretation   I assisted ___________________ with interpretation of stopped by to check on patient's needs______________________ for this patient. 

## 2011-12-07 NOTE — Progress Notes (Signed)
[facility] Chart review completed.  

## 2011-12-08 MED ORDER — IBUPROFEN 600 MG PO TABS: 600.0000 mg | ORAL_TABLET | Freq: Four times a day (QID) | ORAL | Status: AC | PRN

## 2011-12-08 MED ORDER — OXYCODONE-ACETAMINOPHEN 5-325 MG PO TABS: 1.0000 | ORAL_TABLET | ORAL | Status: AC | PRN

## 2011-12-08 MED ORDER — DOCUSATE SODIUM 100 MG PO CAPS: 100.0000 mg | ORAL_CAPSULE | Freq: Two times a day (BID) | ORAL | Status: AC | PRN

## 2011-12-08 NOTE — Discharge Summary (Signed)
Physician Discharge Summary  Patient ID: Lynn Robinson MRN: [id number] DOB/AGE: 39/24/1968 39 y.o.  Admit date: 12/06/2011 Discharge date: 12/08/2011  Admission Diagnoses: Pelvic pain and heavy bleeding due to fibroid uterus  Discharge Diagnoses: s/p Hysterectomy Active Problems:  * No active hospital problems. *    Discharged Condition: stable  Hospital Course: 41 yo G3P3 presented for scheduled TAH for management of pelvic pain due to fibroid uterus. The surgical procedure was uncomplicated revealing an 18-week size fibroid uterus with a weight of 500 gms. The patient's hospital course remained uneventful. She tolerated po intake, was able to void and pass gas, ambulated well and remained afebrile.  Consults: none  Significant Diagnostic Studies: labs: pre-op Hg 13; post-op 10  Treatments: surgery: TAH with cystoscopy  Discharge Exam: Blood pressure 109/64, pulse 79, temperature 98.4 F (36.9 C), temperature source Oral, resp. rate 18, height 5' (1.524 m), weight 83.915 kg (185 lb), last menstrual period 11/17/2011, SpO2 100.00%. General appearance: alert, cooperative and no distress GI: soft, non-tender; bowel sounds normal; no masses,  no organomegaly Extremities: Homans sign is negative, no sign of DVT and no edema, redness or tenderness in the calves or thighs Incision/Wound:no erythema, induration or drainage  Disposition: Final discharge disposition not confirmed  Discharge Orders    Future Appointments: Provider: Department: Dept Phone: Center:   01/11/2012 2:45 PM Catalina Antigua, MD [facility] [phone] [facility]     Medication List  As of 12/08/2011  8:28 AM   TAKE these medications         docusate sodium 100 MG capsule   Commonly known as: COLACE   Take 1 capsule (100 mg total) by mouth 2 (two) times daily as needed for constipation.      ibuprofen 600 MG tablet   Commonly known as: ADVIL,MOTRIN   Take 1 tablet (600 mg total) by mouth every 6 (six) hours as  needed.      loratadine 10 MG tablet   Commonly known as: CLARITIN   Take 10 mg by mouth daily as needed. For allergies      oxyCODONE-acetaminophen 5-325 MG per tablet   Commonly known as: PERCOCET   Take 1-2 tablets by mouth every 4 (four) hours as needed.           Follow-up Information    Follow up with [facility] on 01/11/2012. (at 2:45pm)    Contact information:   [address] [place] [address] [phone]         Signed: Suraj Ramdass 12/08/2011, 8:28 AM

## 2011-12-08 NOTE — Progress Notes (Signed)
2 Days Post-Op Procedure(s): HYSTERECTOMY ABDOMINAL CYSTOSCOPY  Subjective: Patient reports tolerating PO, + flatus and no problems voiding.    Objective: I have reviewed patient's vital signs.  General: alert, cooperative and no distress GI: soft, non-tender; bowel sounds normal; no masses,  no organomegaly and incision: clean, dry and intact Extremities: Homans sign is negative, no sign of DVT and no edema, redness or tenderness in the calves or thighs  Assessment: s/p Procedure(s): HYSTERECTOMY ABDOMINAL CYSTOSCOPY: stable  Plan: Discharge home  LOS: 2 days    Lynn Robinson 12/08/2011, 8:51 AM

## 2011-12-08 NOTE — Progress Notes (Signed)
[facility] Department of Clinical Social Work Documentation of Interpretation   I assisted ___Dr. Constance________________ with interpretation of __discharge plan____________________ for this patient.

## 2012-01-11 ENCOUNTER — Encounter: Payer: Self-pay | Admitting: Obstetrics and Gynecology

## 2012-01-11 ENCOUNTER — Ambulatory Visit (INDEPENDENT_AMBULATORY_CARE_PROVIDER_SITE_OTHER): Payer: Self-pay | Admitting: Obstetrics and Gynecology

## 2012-01-11 VITALS — BP 107/62 | HR 68 | Temp 98.7°F | Ht <= 58 in | Wt 183.7 lb

## 2012-01-11 DIAGNOSIS — Z09 Encounter for follow-up examination after completed treatment for conditions other than malignant neoplasm: Secondary | ICD-10-CM

## 2012-01-11 DIAGNOSIS — Z9889 Other specified postprocedural states: Secondary | ICD-10-CM

## 2012-01-11 NOTE — Progress Notes (Signed)
42 yo G3P3 s/p TAH on 12/06/2011 presenting today for post-op check. Patient is doing well without complaints. Denies abnormal bleeding or pelvic pain.  GENERAL: Well-developed, well-nourished female in no acute distress.  HEENT: Normocephalic, atraumatic. Sclerae anicteric.  NECK: Supple. Normal thyroid.  LUNGS: Clear to auscultation bilaterally.  HEART: Regular rate and rhythm. ABDOMEN: Soft, nontender, nondistended. No organomegaly. Incision: no erythema, induration, or drainage. Healed beautifully PELVIC: Normal external female genitalia. Vagina is pink and rugated.  Normal discharge.  No adnexal mass or tenderness. EXTREMITIES: No cyanosis, clubbing, or edema, 2+ distal pulses.  A/P 41 yo G3P3  s/p TAH in January here for post-op check - patient medically cleared to resume all activities. Patient is allowed another 2 weeks before returning to work - patient to have a mammogram this year- scholarship form completed - patient to return in 1 year or prn

## 2012-01-16 ENCOUNTER — Encounter (HOSPITAL_COMMUNITY): Payer: Self-pay

## 2012-01-16 ENCOUNTER — Ambulatory Visit (INDEPENDENT_AMBULATORY_CARE_PROVIDER_SITE_OTHER): Payer: Self-pay | Admitting: *Deleted

## 2012-01-16 ENCOUNTER — Other Ambulatory Visit: Payer: Self-pay | Admitting: Obstetrics and Gynecology

## 2012-01-16 VITALS — BP 116/73 | HR 70 | Temp 97.3°F | Ht <= 58 in | Wt 187.2 lb

## 2012-01-16 DIAGNOSIS — N6452 Nipple discharge: Secondary | ICD-10-CM

## 2012-01-16 DIAGNOSIS — N63 Unspecified lump in unspecified breast: Secondary | ICD-10-CM

## 2012-01-16 DIAGNOSIS — Z1239 Encounter for other screening for malignant neoplasm of breast: Secondary | ICD-10-CM

## 2012-01-16 DIAGNOSIS — N6459 Other signs and symptoms in breast: Secondary | ICD-10-CM

## 2012-01-16 DIAGNOSIS — N644 Mastodynia: Secondary | ICD-10-CM

## 2012-01-16 NOTE — Patient Instructions (Signed)
Taught patient how to perform BSE and gave educational materials to take home. Patient did not need a Pap smear today due to last Pap smear was 10/11/11.  Let her know BCCCP will cover Pap smears every 3 years unless has a history of abnormal Pap smears. Patient is scheduled for a bilateral diagnostic mammogram Friday, January 19, 2012 at 0815 at the [facility]. Patient aware of appointment and will be there. Let patient know will follow up with her within the next couple weeks with results. Patient verbalized understanding.

## 2012-01-16 NOTE — Progress Notes (Signed)
Complaints right breast lump that noticed 6 months ago that has increased in size, bilateral breast pain and bilateral nipple discharge.  Pap Smear:    Pap smear not performed today. Patients last Pap smear was 10/11/11 at the [facility] and was normal. Per patient she has no history of abnormal Pap smears.  Pap smear result above is in EPIC.  Physical exam: Breasts Breasts symmetrical. No skin abnormalities bilateral breasts. No nipple retraction bilateral breasts. Yellowish colored nipple discharge bilateral breasts. Specimen of expressed nipple discharge sent to cytology per Dr. Jolayne Panther. There is a greater amount of nipple discharge from the right breast. No lymphadenopathy. No lumps palpated left breast. Palpated lump in the right breast around 5 o'clock about 2 cm from the nipple. Patient complained of tenderness when palpated lump. [facility] referred to [facility] for bilateral Diagnostic Mammogram. Appointment scheduled for Friday, January 19, 2012 at 0815.      Pelvic/Bimanual No Pap smear completed today since last Pap smear was 10/11/11 and normal. Pap smear not indicated per BCCCP guidelines.

## 2012-01-19 ENCOUNTER — Ambulatory Visit
Admission: RE | Admit: 2012-01-19 | Discharge: 2012-01-19 | Disposition: A | Discharge status: Home / Self Care | Payer: No Typology Code available for payment source | Source: Ambulatory Visit | Attending: Obstetrics and Gynecology | Admitting: Obstetrics and Gynecology

## 2012-01-19 DIAGNOSIS — N6452 Nipple discharge: Secondary | ICD-10-CM

## 2012-01-19 DIAGNOSIS — N63 Unspecified lump in unspecified breast: Secondary | ICD-10-CM

## 2012-01-19 DIAGNOSIS — N644 Mastodynia: Secondary | ICD-10-CM

## 2012-01-22 ENCOUNTER — Telehealth: Payer: Self-pay | Admitting: *Deleted

## 2012-01-22 NOTE — Telephone Encounter (Signed)
Message copied by Mannie Stabile on Mon Jan 22, 2012  2:01 PM ------      Message from: CONSTANT, PEGGY      Created: Mon Jan 22, 2012 12:11 PM       Please inform patient of normal cytology result from breast discharge. No evidence of cancer. Patient can either follow-up with me for further blood test or ideally with PCP for further evaluation.            Peggy

## 2012-01-22 NOTE — Telephone Encounter (Signed)
Called patient using language line [phone]. Notified her of her results. Pt would like to followup here with Dr Jolayne Panther. Appt made for 3/14 at 3:15pm.

## 2012-02-02 ENCOUNTER — Telehealth: Payer: Self-pay | Admitting: *Deleted

## 2012-02-02 NOTE — Telephone Encounter (Signed)
Patient had nipple discharge when seen by [facility] 01/16/2012. Specimen that was sent was normal. Blood test is recomended. 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 Yearly mammogram screening are recommended. Please let pt know at visit with Dr. Jolayne Panther on March 14.

## 2012-02-08 ENCOUNTER — Ambulatory Visit (INDEPENDENT_AMBULATORY_CARE_PROVIDER_SITE_OTHER): Payer: No Typology Code available for payment source | Admitting: Obstetrics and Gynecology

## 2012-02-08 ENCOUNTER — Encounter: Payer: Self-pay | Admitting: Obstetrics and Gynecology

## 2012-02-08 VITALS — BP 120/69 | HR 98 | Temp 97.0°F | Ht 59.0 in | Wt 188.3 lb

## 2012-02-08 DIAGNOSIS — N6452 Nipple discharge: Secondary | ICD-10-CM

## 2012-02-08 DIAGNOSIS — N6459 Other signs and symptoms in breast: Secondary | ICD-10-CM

## 2012-02-08 NOTE — Progress Notes (Signed)
41 yo G3P3003 presenting today as a follow-up on breast discharge. Patient reports a long standing h/o of drainage from her left breast which she did not mention to me during her pre-operative work-up. Patient reports drainage only from left breast. Result of normal mammogram and benign cytology results reviewed with the patient. Reassurance provided. Patient denies any frequent headaches or visual disturbances We will order prolactin level. Patient will be contacted with any abnormal results. If normal, will refer to breast center

## 2012-02-09 ENCOUNTER — Other Ambulatory Visit: Payer: Self-pay | Admitting: Obstetrics and Gynecology

## 2012-02-09 ENCOUNTER — Telehealth: Payer: Self-pay

## 2012-02-09 DIAGNOSIS — N6452 Nipple discharge: Secondary | ICD-10-CM

## 2012-02-09 NOTE — Telephone Encounter (Signed)
Called pt with Spanish interpreter and spoke with pt's daughter in which she could not speak because she was in school.  Will try to call back with Spanish interpreter @ noon.

## 2012-02-09 NOTE — Telephone Encounter (Signed)
Message copied by Faythe Casa on Fri Feb 09, 2012  9:28 AM ------      Message from: [place], [name]      Created: Fri Feb 09, 2012  8:39 AM       Can this patient be referred to breast center for further evaluation of bilateral milky breast discharge (right greater than left). She had a normal mammogram, normal cytology of the expressed fluid, and normal prolactin level.      I had informed the patient on 3/14 that if all results were normal that I would be referring her to the breast center.                  Thanks      [employer]

## 2012-02-09 NOTE — Telephone Encounter (Signed)
Called pt with spanish interpreter and left message to return call to the clinic.

## 2012-02-09 NOTE — Telephone Encounter (Addendum)
Called [facility] @ [phone] to schedule appt.  Appt scheduled for March 19th @ 0920. Pt needs to be called and scheduled.

## 2012-02-09 NOTE — Telephone Encounter (Deleted)
Message copied by Faythe Casa on Fri Feb 09, 2012  9:40 AM ------      Message from: [address], [name]      Created: Fri Feb 09, 2012  8:39 AM       Can this patient be referred to breast center for further evaluation of bilateral milky breast discharge (right greater than left). She had a normal mammogram, normal cytology of the expressed fluid, and normal prolactin level.      I had informed the patient on 3/14 that if all results were normal that I would be referring her to the breast center.                  Thanks      [employer]

## 2012-02-12 NOTE — Telephone Encounter (Signed)
Called pt w/Lynn Robinson- interpreter. Pt was informed that tests done @ clinic were normal. Pt was notified of appt tomorrow @ [facility] as discussed by Dr. Jolayne Panther. Pt asked for an appt later in the week. Appt was re-scheduled to 02/15/12 @ 0940. Pt agreed and voiced understanding.

## 2012-02-13 ENCOUNTER — Other Ambulatory Visit: Payer: No Typology Code available for payment source

## 2012-02-15 ENCOUNTER — Ambulatory Visit
Admission: RE | Admit: 2012-02-15 | Discharge: 2012-02-15 | Disposition: A | Discharge status: Home / Self Care | Payer: No Typology Code available for payment source | Source: Ambulatory Visit | Attending: Obstetrics and Gynecology | Admitting: Obstetrics and Gynecology

## 2012-02-15 ENCOUNTER — Other Ambulatory Visit: Payer: Self-pay | Admitting: Obstetrics and Gynecology

## 2012-02-15 DIAGNOSIS — N6452 Nipple discharge: Secondary | ICD-10-CM

## 2012-02-20 ENCOUNTER — Telehealth: Payer: Self-pay | Admitting: *Deleted

## 2012-02-20 NOTE — Telephone Encounter (Signed)
Telephoned home #. [employer] customer not available.

## 2012-02-22 ENCOUNTER — Telehealth: Payer: Self-pay | Admitting: *Deleted

## 2012-02-22 NOTE — Telephone Encounter (Signed)
Patient called with interpreter Nira Conn to verify that patient has received results from the Breast Center and check if has any questions. Patient aware of results and that she will need a yearly screening mammogram in 1 year. Patient verbalized understanding.

## 2013-01-27 ENCOUNTER — Other Ambulatory Visit: Payer: Self-pay

## 2013-03-10 ENCOUNTER — Other Ambulatory Visit: Payer: Self-pay | Admitting: *Deleted

## 2013-03-10 DIAGNOSIS — N63 Unspecified lump in unspecified breast: Secondary | ICD-10-CM

## 2013-03-11 ENCOUNTER — Ambulatory Visit (HOSPITAL_COMMUNITY): Payer: Self-pay

## 2013-03-21 ENCOUNTER — Other Ambulatory Visit: Payer: Self-pay

## 2013-03-31 ENCOUNTER — Ambulatory Visit: Payer: Self-pay | Admitting: Obstetrics & Gynecology

## 2013-05-08 ENCOUNTER — Ambulatory Visit (INDEPENDENT_AMBULATORY_CARE_PROVIDER_SITE_OTHER): Payer: Self-pay | Admitting: Obstetrics & Gynecology

## 2013-05-08 ENCOUNTER — Encounter: Payer: Self-pay | Admitting: Obstetrics & Gynecology

## 2013-05-08 VITALS — BP 121/66 | HR 74 | Temp 98.7°F | Ht 62.0 in | Wt 188.0 lb

## 2013-05-08 DIAGNOSIS — K625 Hemorrhage of anus and rectum: Secondary | ICD-10-CM

## 2013-05-08 DIAGNOSIS — R109 Unspecified abdominal pain: Secondary | ICD-10-CM

## 2013-05-08 NOTE — Progress Notes (Signed)
Patient ID: Lynn Robinson, female   DOB: 01/20/1967, 42 y.o.   MRN: [id number]  Chief Complaint  Patient presents with  . Follow-up    pain at ovaries, 12/06/11 pt had total hysterectomy    HPI Lynn Robinson is a 42 y.o. female.  [id number] Patient's last menstrual period was 11/17/2011. Patient has TAH Jan. 2013. Several months of LLQ pain worse when she bends over and straightens. She also has constipation and blood per rectum, bright red.  HPI  Past Medical History  Diagnosis Date  . Fibroids   . Seasonal allergies   . Shortness of breath     on exertion only  . Anxiety   . GERD (gastroesophageal reflux disease)     occasional reflux-no meds    Past Surgical History  Procedure Laterality Date  . Tubal ligation    . Abdominal hysterectomy  12/06/2011    Procedure: HYSTERECTOMY ABDOMINAL;  Surgeon: Lynn Antigua, MD;  Location: [facility];  Service: Gynecology;  Laterality: N/A;  . Cystoscopy  12/06/2011    Procedure: CYSTOSCOPY;  Surgeon: Lynn Antigua, MD;  Location: [facility];  Service: Gynecology;;    Family History  Problem Relation Age of Onset  . Breast cancer Mother     Social History History  Substance Use Topics  . Smoking status: Never Smoker   . Smokeless tobacco: Never Used  . Alcohol Use: No    No Known Allergies  Current Outpatient Prescriptions  Medication Sig Dispense Refill  . cetirizine (ZYRTEC) 10 MG tablet Take 10 mg by mouth daily.      Marland Kitchen docusate sodium (COLACE) 100 MG capsule Take 100 mg by mouth 2 (two) times daily.      Marland Kitchen ibuprofen (ADVIL,MOTRIN) 600 MG tablet Take 600 mg by mouth every 6 (six) hours as needed.      . loratadine (CLARITIN) 10 MG tablet Take 10 mg by mouth daily as needed. For allergies       . oxyCODONE-acetaminophen (PERCOCET) 5-325 MG per tablet Take 1 tablet by mouth every 4 (four) hours as needed.       No current facility-administered medications for this visit.    Review of Systems Review of Systems   Constitutional: Negative for fever, appetite change and unexpected weight change.  Gastrointestinal: Positive for abdominal pain, constipation, blood in stool and anal bleeding. Negative for nausea, vomiting, diarrhea, abdominal distention and rectal pain.  Genitourinary: Positive for pelvic pain. Negative for dysuria, hematuria, vaginal bleeding, vaginal discharge and vaginal pain.    Blood pressure 121/66, pulse 74, temperature 98.7 F (37.1 C), temperature source Oral, height 5\' 2"  (1.575 m), weight 188 lb (85.276 kg), last menstrual period 11/17/2011.  Physical Exam Physical Exam  Constitutional: She is oriented to person, place, and time. She appears well-developed. No distress.  obese  Pulmonary/Chest: Effort normal. No respiratory distress.  Abdominal: Soft. She exhibits no mass. There is tenderness (mild). There is no rebound and no guarding.  Genitourinary: Vagina normal. No vaginal discharge found.  No pelvic mass or tenderness  Neurological: She is alert and oriented to person, place, and time.  Skin: Skin is warm.  Psychiatric: She has a normal mood and affect. Her behavior is normal.    Data Reviewed Op report  Assessment    LLQ pain daily, positional, not severe. No significant pelvic findings Rectal bleeding     Plan    GI referral        Lynn Robinson 05/08/2013, 3:50 PM

## 2013-05-08 NOTE — Patient Instructions (Signed)
Dolor abdominal, versión ampliada °(Abdominal Pain, Nonspecific) °El análisis podría no mostrar la razón exacta por la que tiene dolor abdominal. Debido a que hay muchas causas distintas de dolor abdominal, se podrá necesitar otro control y más análisis. Es muy importante el seguimiento para observar los síntomas duraderos (persistentes) o los que empeoran. Una causa posible de dolor abdominal en cualquier persona que aún tiene su apéndice es la apendicitis aguda. La apendicitis es a menudo difícil de diagnosticar. Los análisis de sangre, orina, ultrasonido y tomografía computada no pueden descartar por completo la apendicitis u otra causas de dolor abdominal. A veces, sólo los cambios que se producen a través del tiempo permitirán determinar si el dolor abdominal se debe al apendicitis o a otras causas. Otros problemas potenciales que pueden requerir cirugía también pueden tomar algún tiempo hasta ser evidentes. Debido a esto, es importante seguir todas las instrucciones de más abajo. °INSTRUCCIONES PARA EL CUIDADO DOMICILIARIO °· Descanse todo lo que pueda. °· No ingiera alimentos sólidos hasta que el dolor desaparezca. °· Cuando un adulto o un niño siente dolor: Puede beneficiarlo una dieta basada en agua, té liviano descafeinado, caldo o consomé, gelatina, solución de rehidratación oral, helados de agua o trocitos de hielo. °· Cuando el adulto o el niño no sienten más dolor: Consuma una dieta liviana (tostadas secas, crackers, jugo de manzana o arroz blanco). Incorpore más alimentos lentamente, siempre que esto no le cause ningún trastorno. No consuma productos lácteos (incluyendo queso y huevos) ni ingiera alimentos condimentados, grasos, fritos o con gran cantidad de fibra. °· No consuma alcohol, cafeína ni cigarrillos. °· Tome sus medicamentos regularmente, excepto que el profesional le indique lo contrario. °· Utilice los medicamentos de venta libre o de prescripción para el dolor, el malestar o la fiebre,  según se lo indique el profesional que lo asiste. °· Utilice los medicamentos de venta libre o de prescripción para el dolor, el malestar o la fiebre, según se lo indique el profesional que lo asiste. No administre aspirina a los niños. °Si el médico le ha dado fecha para una visita de control, es importante que concurra. No cumplir con este control puede dar como resultado que el daño, el dolor o la discapacidad sean permanentes (crónicos). Si tiene problemas para asistir al control, deberá comunicarlo en este establecimiento para recibir asesoramiento.  °SOLICITE ATENCIÓN MÉDICA DE INMEDIATO SI: °· Usted o su niño han sufrido dolor por más de 24 horas. °· El dolor empeora, cambia de lugar o se siente diferente. °· Usted o su niño tienen una temperatura oral de más de 38,9° C (102° F) y no puede ser controlada con medicamentos. °· Su bebé tiene más de 3 meses y su temperatura rectal es de 102° F (38.9° C) o más. °· Su bebé tiene 3 meses o menos y su temperatura rectal es de 100.4° F (38° C) o más. °· Usted o su hijo tienen escalofríos. °· Continúan con vómitos y no pueden retener líquidos. °· Observa sangre en el vómito o en la materia fecal. °· Las heces son oscuras o negras. °· Los movimientos intestinales son frecuentes. °· Los movimientos intestinales se detienen (hay una obstrucción) o no pueden eliminarse los gases. °· Siente dolor al orinar o lo hace con frecuencia u observa sangre en la orina. °· La piel y la zona blanca de los ojos cambian de color y se tornan amarillos. °· Observa que el estómago se hincha o está más grande. °· Sienten mareos o desmayos. °· Sienten dolor en   el pecho o la espalda. °ESTÉ SEGURO QUE:  °· Comprende las instrucciones para el alta médica. °· Controlará su enfermedad. °· Solicitará atención médica de inmediato según las indicaciones. °Document Released: 02/20/2008 Document Revised: 02/05/2012 °ExitCare® Patient Information ©2014 [employer]. ° °

## 2013-05-14 ENCOUNTER — Ambulatory Visit: Payer: Self-pay

## 2013-08-20 ENCOUNTER — Ambulatory Visit: Payer: Self-pay | Admitting: Internal Medicine

## 2013-09-10 ENCOUNTER — Encounter: Payer: Self-pay | Admitting: Internal Medicine

## 2013-09-10 ENCOUNTER — Ambulatory Visit: Payer: Self-pay | Attending: Internal Medicine | Admitting: Internal Medicine

## 2013-09-10 VITALS — BP 122/73 | HR 72 | Temp 98.0°F | Resp 16 | Ht 59.45 in | Wt 191.0 lb

## 2013-09-10 DIAGNOSIS — Z90710 Acquired absence of both cervix and uterus: Secondary | ICD-10-CM | POA: Insufficient documentation

## 2013-09-10 DIAGNOSIS — J301 Allergic rhinitis due to pollen: Secondary | ICD-10-CM | POA: Insufficient documentation

## 2013-09-10 DIAGNOSIS — Z79899 Other long term (current) drug therapy: Secondary | ICD-10-CM | POA: Insufficient documentation

## 2013-09-10 DIAGNOSIS — K625 Hemorrhage of anus and rectum: Secondary | ICD-10-CM | POA: Insufficient documentation

## 2013-09-10 NOTE — Progress Notes (Signed)
Pt is here to establish care. Pt reports having bloody stool occasionally and is requesting a colonoscopy.

## 2013-09-10 NOTE — Progress Notes (Signed)
Patient ID: Lynn Robinson, female   DOB: 01/20/1967, 42 y.o.   MRN: [id number]  PCP:  Jeanann Lewandowsky, MD    Chief Complaint:  Bright red blood per rectum for a few years  HPI: 42 year old Hispanic female with history of thyroid uterus status post hysterectomy, who presents to the tank for history of bright red blood per rectum for past few years.. Patient reports having bright rectal bleed occuring 1-2 times a month , copious in amount we look is no abdominal cramping. Previous CBC in stool color with severe constipation but now for past few months she has been having this despite regular BMs.  Patient denies any fever, chills, headache, dizziness, blurred vision, nausea, vomiting, urinary symptoms. Denies history of hemorrhoids. Denies any tingling or with appetite. Denies any abnormal bowel habits. Denies diarrhea. Denies hematemesis or melena. She denies use of over-the-counter NSAIDs at present. Her GYN referred her to [facility] 2 months back and was recommended for a colonoscopy but could not have one because of insurance issues. Her last symptom was 2 weeks back.  Allergies: No Known Allergies  Prior to Admission medications   Medication Sig Start Date End Date Taking? Authorizing Provider  cetirizine (ZYRTEC) 10 MG tablet Take 10 mg by mouth daily.    Historical Provider, MD  docusate sodium (COLACE) 100 MG capsule Take 100 mg by mouth 2 (two) times daily.    Historical Provider, MD  ibuprofen (ADVIL,MOTRIN) 600 MG tablet Take 600 mg by mouth every 6 (six) hours as needed.    Historical Provider, MD  loratadine (CLARITIN) 10 MG tablet Take 10 mg by mouth daily as needed. For allergies     Historical Provider, MD  oxyCODONE-acetaminophen (PERCOCET) 5-325 MG per tablet Take 1 tablet by mouth every 4 (four) hours as needed.    Historical Provider, MD    Past Medical History  Diagnosis Date  . Fibroids   . Seasonal allergies   . Shortness of breath     on exertion only  .  Anxiety   . GERD (gastroesophageal reflux disease)     occasional reflux-no meds    Past Surgical History  Procedure Laterality Date  . Tubal ligation    . Abdominal hysterectomy  12/06/2011    Procedure: HYSTERECTOMY ABDOMINAL;  Surgeon: Catalina Antigua, MD;  Location: [facility];  Service: Gynecology;  Laterality: N/A;  . Cystoscopy  12/06/2011    Procedure: CYSTOSCOPY;  Surgeon: Catalina Antigua, MD;  Location: [facility];  Service: Gynecology;;    Social History:  reports that she has never smoked. She has never used smokeless tobacco. She reports that she does not drink alcohol or use illicit drugs.  Family History  Problem Relation Age of Onset  . Breast cancer Mother     Review of Systems:  As outlined in the history of present illness   Physical Exam:  Filed Vitals:   09/10/13 1629  BP: 122/73  Pulse: 72  Temp: 98 F (36.7 C)  TempSrc: Oral  Resp: 16  Height: 4' 11.45" (1.51 m)  Weight: 191 lb (86.637 kg)  SpO2: 100%    Constitutional: Vital signs reviewed.  Patient is a middle aged obese female in no acute distress HEENT: No pallor, moist oral mucosa Chest: Clear to auscultation bilaterally, no added sounds CVS: Normal S1 and S2, no murmurs Abdomen: Soft, nondistended, nontender, bowel sounds present, headache exam with no hemorrhoids  Trinities: Warm, no edema CNS: AAO x3  Labs on Admission:  No results found  for this or any previous visit (from the past 48 hour(s)).    Assessment/Plan Bright red  blood per rectum I will refer her to  Outpatient GI for further evaluation and need for colonoscopy. Check CBC Return to ED or back to the clinic if symptoms persist or worsen or has associated symptoms of dizziness or  lightheadedness .   Katoria Yetman 09/10/2013, 4:46 PM

## 2013-09-11 LAB — CBC
MCH: 30 pg (ref 26.0–34.0)
MCV: 86 fL (ref 78.0–100.0)
Platelets: 270 10*3/uL (ref 150–400)
RBC: 4.06 MIL/uL (ref 3.87–5.11)
RDW: 13.4 % (ref 11.5–15.5)

## 2013-10-02 ENCOUNTER — Ambulatory Visit: Payer: Self-pay | Attending: Internal Medicine | Admitting: Internal Medicine

## 2013-10-02 VITALS — BP 111/67 | HR 69 | Temp 98.0°F | Resp 17

## 2013-10-02 DIAGNOSIS — K625 Hemorrhage of anus and rectum: Secondary | ICD-10-CM

## 2013-10-02 DIAGNOSIS — N390 Urinary tract infection, site not specified: Secondary | ICD-10-CM | POA: Insufficient documentation

## 2013-10-02 DIAGNOSIS — R109 Unspecified abdominal pain: Secondary | ICD-10-CM

## 2013-10-02 DIAGNOSIS — Z0000 Encounter for general adult medical examination without abnormal findings: Secondary | ICD-10-CM

## 2013-10-02 LAB — POCT URINALYSIS DIPSTICK
Blood, UA: NEGATIVE
Leukocytes, UA: NEGATIVE
Nitrite, UA: NEGATIVE
Protein, UA: NEGATIVE
Urobilinogen, UA: 0.2
pH, UA: 6.5

## 2013-10-02 MED ORDER — SULFAMETHOXAZOLE-TMP DS 800-160 MG PO TABS: 1.0000 | ORAL_TABLET | Freq: Two times a day (BID) | ORAL | Status: DC

## 2013-10-02 NOTE — Progress Notes (Signed)
Patient needs referral to [facility] for colonoscopy Has been having pain when she voids lower flank back pain

## 2013-10-02 NOTE — Patient Instructions (Addendum)
  Infeccin urinaria  (Urinary Tract Infection)  La infeccin urinaria puede ocurrir en cualquier lugar del tracto urinario. El tracto urinario es un sistema de drenaje del cuerpo por el que se eliminan los desechos y el exceso de agua. El tracto urinario est formado por dos riones, dos urteres, la vejiga y la uretra. Los riones son rganos que tienen forma de frijol. Cada rin tiene aproximadamente el tamao del puo. Estn situados debajo de las costillas, uno a cada lado de la columna vertebral CAUSAS  La causa de la infeccin son los microbios, que son organismos microscpicos, que incluyen hongos, virus, y bacterias. Estos organismos son tan pequeos que slo pueden verse a travs del microscopio. Las bacterias son los microorganismos que ms comnmente causan infecciones urinarias.  SNTOMAS  Los sntomas pueden variar segn la edad y el sexo del paciente y por la ubicacin de la infeccin. Los sntomas en las mujeres jvenes incluyen la necesidad frecuente e intensa de orinar y una sensacin dolorosa de ardor en la vejiga o en la uretra durante la miccin. Las mujeres y los hombres mayores podrn sentir cansancio, temblores y debilidad y sentir dolores musculares y dolor abdominal. Si tiene fiebre, puede significar que la infeccin est en los riones. Otros sntomas son dolor en la espalda o en los lados debajo de las costillas, nuseas y vmitos.  DIAGNSTICO  Para diagnosticar una infeccin urinaria, el mdico le preguntar acerca de sus sntomas. Tambin le solicitar una muestra de orina. La muestra de orina se analiza para detectar bacterias y glbulos blancos de la sangre. Los glbulos blancos se forman en el organismo para ayudar a combatir las infecciones.  TRATAMIENTO  Por lo general, las infecciones urinarias pueden tratarse con medicamentos. Debido a que la mayora de las infecciones son causadas por bacterias, por lo general pueden tratarse con antibiticos. La eleccin del  antibitico y la duracin del tratamiento depender de sus sntomas y el tipo de bacteria causante de la infeccin.  INSTRUCCIONES PARA EL CUIDADO EN EL HOGAR   Si le recetaron antibiticos, tmelos exactamente como su mdico le indique. Termine el medicamento aunque se sienta mejor despus de haber tomado slo algunos.  Beba gran cantidad de lquido para mantener la orina de tono claro o color amarillo plido.  Evite la cafena, el t y las bebidas gaseosas. Estas sustancias irritan la vejiga.  Vaciar la vejiga con frecuencia. Evite retener la orina durante largos perodos.  Vace la vejiga antes y despus de tener relaciones sexuales.  Despus de mover el intestino, las mujeres deben higienizarse la regin perineal desde adelante hacia atrs. Use slo un papel tissue por vez. SOLICITE ATENCIN MDICA SI:   Siente dolor en la espalda.  Le sube la fiebre.  Los sntomas no mejoran luego de 3 das. SOLICITE ATENCIN MDICA DE INMEDIATO SI:   Siente dolor intenso en la espalda o en la zona inferior del abdomen.  Comienza a sentir escalofros.  Tiene nuseas o vmitos.  Tiene una sensacin continua de quemazn o molestias al orinar. ASEGRESE DE QUE:   Comprende estas instrucciones.  Controlar su enfermedad.  Solicitar ayuda de inmediato si no mejora o empeora. Document Released: 08/23/2005 Document Revised: 08/07/2012 [employer] Patient Information 2014 [employer].  

## 2013-10-02 NOTE — Progress Notes (Signed)
Patient ID: Lynn Robinson, female   DOB: 01/20/1967, 42 y.o.   MRN: [id number]  CC: Dysuria  HPI: 42 year old female with past medical history of GERD and anxiety who presented to clinic for followup. Patient reports having burning sensation on urination, subjective fevers but no chills. Patient reports difficulty on initial urination but good stream. She reports having associated back pain. No blood in the urine.  No Known Allergies Past Medical History  Diagnosis Date  . Fibroids   . Seasonal allergies   . Shortness of breath     on exertion only  . Anxiety   . GERD (gastroesophageal reflux disease)     occasional reflux-no meds   Current Outpatient Prescriptions on File Prior to Visit  Medication Sig Dispense Refill  . cetirizine (ZYRTEC) 10 MG tablet Take 10 mg by mouth daily.      Marland Kitchen docusate sodium (COLACE) 100 MG capsule Take 100 mg by mouth 2 (two) times daily.      Marland Kitchen ibuprofen (ADVIL,MOTRIN) 600 MG tablet Take 600 mg by mouth every 6 (six) hours as needed.      . loratadine (CLARITIN) 10 MG tablet Take 10 mg by mouth daily as needed. For allergies       . oxyCODONE-acetaminophen (PERCOCET) 5-325 MG per tablet Take 1 tablet by mouth every 4 (four) hours as needed.       No current facility-administered medications on file prior to visit.   Family History  Problem Relation Age of Onset  . Breast cancer Mother    History   Social History  . Marital Status: Married    Spouse Name: N/A    Number of Children: N/A  . Years of Education: N/A   Occupational History  . Not on file.   Social History Main Topics  . Smoking status: Never Smoker   . Smokeless tobacco: Never Used  . Alcohol Use: No  . Drug Use: No  . Sexual Activity: Yes    Birth Control/ Protection: Surgical   Other Topics Concern  . Not on file   Social History Narrative  . No narrative on file    Review of Systems  Constitutional: Negative for fever, chills, diaphoresis, activity change,  appetite change and fatigue.  HENT: Negative for ear pain, nosebleeds, congestion, facial swelling, rhinorrhea, neck pain, neck stiffness and ear discharge.   Eyes: Negative for pain, discharge, redness, itching and visual disturbance.  Respiratory: Negative for cough, choking, chest tightness, shortness of breath, wheezing and stridor.   Cardiovascular: Negative for chest pain, palpitations and leg swelling.  Gastrointestinal: Negative for abdominal distention.  Genitourinary: Per history of present illness  Musculoskeletal: Negative for back pain, joint swelling, arthralgias and gait problem.  Neurological: Negative for dizziness, tremors, seizures, syncope, facial asymmetry, speech difficulty, weakness, light-headedness, numbness and headaches.  Hematological: Negative for adenopathy. Does not bruise/bleed easily.  Psychiatric/Behavioral: Negative for hallucinations, behavioral problems, confusion, dysphoric mood, decreased concentration and agitation.    Objective:   Filed Vitals:   10/02/13 1427  BP: 111/67  Pulse: 69  Temp: 98 F (36.7 C)  Resp: 17    Physical Exam  Constitutional: Appears well-developed and well-nourished. No distress.  HENT: Normocephalic. External right and left ear normal. Oropharynx is clear and moist.  Eyes: Conjunctivae and EOM are normal. PERRLA, no scleral icterus.  Neck: Normal ROM. Neck supple. No JVD. No tracheal deviation. No thyromegaly.  CVS: RRR, S1/S2 +, no murmurs, no gallops, no carotid bruit.  Pulmonary: Effort and breath  sounds normal, no stridor, rhonchi, wheezes, rales.  Abdominal: Soft. BS +,  no distension, tenderness, rebound or guarding.  Musculoskeletal: Normal range of motion. No edema and no tenderness.  Lymphadenopathy: No lymphadenopathy noted, cervical, inguinal. Neuro: Alert. Normal reflexes, muscle tone coordination. No cranial nerve deficit. Skin: Skin is warm and dry. No rash noted. Not diaphoretic. No erythema. No pallor.   Psychiatric: Normal mood and affect. Behavior, judgment, thought content normal.   Lab Results  Component Value Date   WBC 9.4 09/10/2013   HGB 12.2 09/10/2013   HCT 34.9* 09/10/2013   MCV 86.0 09/10/2013   PLT 270 09/10/2013   No results found for this basename: CREATININE, BUN, NA, K, CL, CO2    No results found for this basename: HGBA1C   Lipid Panel  No results found for this basename: chol, trig, hdl, cholhdl, vldl, ldlcalc       Assessment and plan:   Patient Active Problem List   Diagnosis Date Noted  . UTI (urinary tract infection) 10/02/2013    Priority: Medium - With possibility of kidney infection. Start Bactrim twice a day for 5 days. Obtain urinalysis and renal ultrasound   . Preventative health care 10/02/2013    Priority: Medium - GI referral provided for colonoscopy

## 2013-10-07 ENCOUNTER — Ambulatory Visit (HOSPITAL_COMMUNITY)
Admission: RE | Admit: 2013-10-07 | Discharge: 2013-10-07 | Disposition: A | Discharge status: Home / Self Care | Payer: Self-pay | Source: Ambulatory Visit | Attending: Internal Medicine | Admitting: Internal Medicine

## 2013-10-07 DIAGNOSIS — Z0000 Encounter for general adult medical examination without abnormal findings: Secondary | ICD-10-CM

## 2013-10-07 DIAGNOSIS — K625 Hemorrhage of anus and rectum: Secondary | ICD-10-CM

## 2013-10-07 DIAGNOSIS — N390 Urinary tract infection, site not specified: Secondary | ICD-10-CM

## 2013-10-07 DIAGNOSIS — R109 Unspecified abdominal pain: Secondary | ICD-10-CM

## 2013-10-07 DIAGNOSIS — Z87440 Personal history of urinary (tract) infections: Secondary | ICD-10-CM | POA: Insufficient documentation

## 2013-12-29 ENCOUNTER — Ambulatory Visit: Payer: Self-pay | Attending: Internal Medicine | Admitting: Internal Medicine

## 2013-12-29 ENCOUNTER — Encounter: Payer: Self-pay | Admitting: Internal Medicine

## 2013-12-29 VITALS — BP 125/78 | HR 77 | Temp 98.9°F | Resp 14 | Ht 61.0 in | Wt 196.8 lb

## 2013-12-29 DIAGNOSIS — N3090 Cystitis, unspecified without hematuria: Secondary | ICD-10-CM | POA: Insufficient documentation

## 2013-12-29 DIAGNOSIS — N390 Urinary tract infection, site not specified: Secondary | ICD-10-CM

## 2013-12-29 LAB — POCT URINALYSIS DIPSTICK
BILIRUBIN UA: NEGATIVE
Blood, UA: NEGATIVE
Glucose, UA: NEGATIVE
KETONES UA: NEGATIVE
Nitrite, UA: NEGATIVE
Protein, UA: NEGATIVE
Spec Grav, UA: 1.02
Urobilinogen, UA: 1
pH, UA: 7.5

## 2013-12-29 LAB — POCT GLYCOSYLATED HEMOGLOBIN (HGB A1C): HEMOGLOBIN A1C: 4.8

## 2013-12-29 MED ORDER — CIPROFLOXACIN HCL 500 MG PO TABS: 500.0000 mg | ORAL_TABLET | Freq: Two times a day (BID) | ORAL | Status: DC

## 2013-12-29 NOTE — Progress Notes (Signed)
Pt is here for a office visit. Complains of being unable to urinate, has the urge to go x3 days. Feels pain while urination; has a history of infection. Also complains of abdominal pain. Thinks that pain is linked to medication. Slight nausea, no vomiting, no odor. This is the 3rd time pt has a possible UTI.

## 2013-12-29 NOTE — Progress Notes (Signed)
Patient ID: Lynn Robinson, female   DOB: 01/20/1967, 43 y.o.   MRN: [id number]   CC:  HPI: 43 year old female here for dysuria. The patient has been having symptoms for about 3 days. She complains of suprapubic discomfort as well as pain around her urethra. Increase frequency, no fever no flank pain   No Known Allergies Past Medical History  Diagnosis Date  . Fibroids   . Seasonal allergies   . Shortness of breath     on exertion only  . Anxiety   . GERD (gastroesophageal reflux disease)     occasional reflux-no meds   Current Outpatient Prescriptions on File Prior to Visit  Medication Sig Dispense Refill  . cetirizine (ZYRTEC) 10 MG tablet Take 10 mg by mouth daily.      Marland Kitchen docusate sodium (COLACE) 100 MG capsule Take 100 mg by mouth 2 (two) times daily.      Marland Kitchen loratadine (CLARITIN) 10 MG tablet Take 10 mg by mouth daily as needed. For allergies       . oxyCODONE-acetaminophen (PERCOCET) 5-325 MG per tablet Take 1 tablet by mouth every 4 (four) hours as needed.       No current facility-administered medications on file prior to visit.   Family History  Problem Relation Age of Onset  . Breast cancer Mother    History   Social History  . Marital Status: Married    Spouse Name: N/A    Number of Children: N/A  . Years of Education: N/A   Occupational History  . Not on file.   Social History Main Topics  . Smoking status: Never Smoker   . Smokeless tobacco: Never Used  . Alcohol Use: No  . Drug Use: No  . Sexual Activity: Yes    Birth Control/ Protection: Surgical   Other Topics Concern  . Not on file   Social History Narrative  . No narrative on file    Review of Systems  Constitutional: Negative for fever, chills, diaphoresis, activity change, appetite change and fatigue.  HENT: Negative for ear pain, nosebleeds, congestion, facial swelling, rhinorrhea, neck pain, neck stiffness and ear discharge.   Eyes: Negative for pain, discharge, redness, itching and  visual disturbance.  Respiratory: Negative for cough, choking, chest tightness, shortness of breath, wheezing and stridor.   Cardiovascular: Negative for chest pain, palpitations and leg swelling.  Gastrointestinal: Negative for abdominal distention.  Genitourinary: As in history of present illness Musculoskeletal: Negative for back pain, joint swelling, arthralgias and gait problem.  Neurological: Negative for dizziness, tremors, seizures, syncope, facial asymmetry, speech difficulty, weakness, light-headedness, numbness and headaches.  Hematological: Negative for adenopathy. Does not bruise/bleed easily.  Psychiatric/Behavioral: Negative for hallucinations, behavioral problems, confusion, dysphoric mood, decreased concentration and agitation.    Objective:   Filed Vitals:   12/29/13 1613  BP: 125/78  Pulse: 77  Temp: 98.9 F (37.2 C)  Resp: 14    Physical Exam  Constitutional: Appears well-developed and well-nourished. No distress.  HENT: Normocephalic. External right and left ear normal. Oropharynx is clear and moist.  Eyes: Conjunctivae and EOM are normal. PERRLA, no scleral icterus.  Neck: Normal ROM. Neck supple. No JVD. No tracheal deviation. No thyromegaly.  CVS: RRR, S1/S2 +, no murmurs, no gallops, no carotid bruit.  Pulmonary: Effort and breath sounds normal, no stridor, rhonchi, wheezes, rales.  Abdominal: Soft. BS +,  no distension, tenderness, rebound or guarding.  Musculoskeletal: Normal range of motion. No edema and no tenderness.  Lymphadenopathy: No lymphadenopathy noted,  cervical, inguinal. Neuro: Alert. Normal reflexes, muscle tone coordination. No cranial nerve deficit. Skin: Skin is warm and dry. No rash noted. Not diaphoretic. No erythema. No pallor.  Psychiatric: Normal mood and affect. Behavior, judgment, thought content normal.   Lab Results  Component Value Date   WBC 9.4 09/10/2013   HGB 12.2 09/10/2013   HCT 34.9* 09/10/2013   MCV 86.0 09/10/2013    PLT 270 09/10/2013   No results found for this basename: CREATININE, BUN, NA, K, CL, CO2    No results found for this basename: HGBA1C   Lipid Panel  No results found for this basename: chol, trig, hdl, cholhdl, vldl, ldlcalc       Assessment and plan:   Patient Active Problem List   Diagnosis Date Noted  . UTI (urinary tract infection) 10/02/2013  . Preventative health care 10/02/2013       Cystitis Urine dipstick mildly positive We'll prescribe ciprofloxacin for 7 days Rule out diabetes given her current UTI Renal ultrasound on 11/14 was negative     The patient was given clear instructions to go to ER or return to medical center if symptoms don't improve, worsen or new problems develop. The patient verbalized understanding. The patient was told to call to get any lab results if not heard anything in the next week.

## 2013-12-30 LAB — COMPLETE METABOLIC PANEL WITH GFR
ALT: 11 U/L (ref 0–35)
AST: 11 U/L (ref 0–37)
Albumin: 4.1 g/dL (ref 3.5–5.2)
Alkaline Phosphatase: 48 U/L (ref 39–117)
BILIRUBIN TOTAL: 0.3 mg/dL (ref 0.2–1.2)
BUN: 16 mg/dL (ref 6–23)
CO2: 26 meq/L (ref 19–32)
CREATININE: 0.5 mg/dL (ref 0.50–1.10)
Calcium: 8.5 mg/dL (ref 8.4–10.5)
Chloride: 106 mEq/L (ref 96–112)
GFR, Est Non African American: >89 mL/min
Glucose, Bld: 93 mg/dL (ref 70–99)
Potassium: 4.1 mEq/L (ref 3.5–5.3)
Sodium: 140 mEq/L (ref 135–145)
Total Protein: 6.2 g/dL (ref 6.0–8.3)

## 2013-12-31 LAB — URINE CULTURE: Colony Count: 30000

## 2014-01-01 ENCOUNTER — Telehealth: Payer: Self-pay | Admitting: *Deleted

## 2014-01-01 NOTE — Telephone Encounter (Signed)
Message copied by Kooper Godshall, [place] R on Thu Jan 01, 2014  4:11 PM ------      Message from: Allyson Sabal MD, [facility]      Created: Tue Dec 30, 2013  9:50 AM       Notify patient of the labs are normal ------

## 2014-05-28 ENCOUNTER — Ambulatory Visit: Payer: Self-pay | Admitting: Internal Medicine

## 2014-09-28 ENCOUNTER — Encounter: Payer: Self-pay | Admitting: Internal Medicine

## 2017-04-18 LAB — GLUCOSE, POCT (MANUAL RESULT ENTRY): POC GLUCOSE: 68 mg/dL — AB (ref 70–99)

## 2021-05-14 ENCOUNTER — Encounter: Payer: Self-pay | Admitting: *Deleted

## 2021-05-14 ENCOUNTER — Ambulatory Visit
Admission: EM | Admit: 2021-05-14 | Discharge: 2021-05-14 | Disposition: A | Discharge status: Home / Self Care | Payer: 59

## 2021-05-14 ENCOUNTER — Other Ambulatory Visit: Payer: Self-pay

## 2021-05-14 DIAGNOSIS — M7061 Trochanteric bursitis, right hip: Secondary | ICD-10-CM | POA: Diagnosis not present

## 2021-05-14 HISTORY — DX: Pure hypercholesterolemia, unspecified: E78.00

## 2021-05-14 MED ORDER — PREDNISONE 10 MG (21) PO TBPK: ORAL_TABLET | Freq: Every day | ORAL | 0 refills | Status: DC

## 2021-05-14 NOTE — ED Triage Notes (Addendum)
C/O transient right hip pains over past few yrs without known injury.  C/O pain becoming significantly worse and more constant over past 2 days.  Denies parasthesias.  Pt ambulates with limp.

## 2021-05-14 NOTE — ED Provider Notes (Signed)
[facility]    CSN: [id number] Arrival date & time: 05/14/21  1234      History   Chief Complaint Chief Complaint  Patient presents with   Hip Pain    HPI Lynn Robinson is a 50 y.o. female.   Pt complains of right hip pain that started several years ago and has gradually worsened over the last 2 days.  She denies radiation of pain, numbness, tingling, weakness, back pain. She denies injury or trauma. She has tried tylenol with minimal improvement.  Pain is worse with ambulation and lying on the right side.   Past Medical History:  Diagnosis Date   Anxiety    Fibroids    GERD (gastroesophageal reflux disease)    occasional reflux-no meds   Hypercholesteremia    Seasonal allergies    Shortness of breath    on exertion only    Patient Active Problem List   Diagnosis Date Noted   UTI (urinary tract infection) 10/02/2013   Preventative health care 10/02/2013    Past Surgical History:  Procedure Laterality Date   ABDOMINAL HYSTERECTOMY  12/06/2011   Procedure: HYSTERECTOMY ABDOMINAL;  Surgeon: Mora Bellman, MD;  Location: [facility];  Service: Gynecology;  Laterality: N/A;   CYSTOSCOPY  12/06/2011   Procedure: CYSTOSCOPY;  Surgeon: Mora Bellman, MD;  Location: [facility];  Service: Gynecology;;   TUBAL LIGATION      OB History     Gravida  3   Para  3   Term  3   Preterm      AB      Living  3      SAB      IAB      Ectopic      Multiple      Live Births               Home Medications    Prior to Admission medications   Medication Sig Start Date End Date Taking? Authorizing Provider  ESCITALOPRAM OXALATE PO Take by mouth.   Yes [provider]  predniSONE (STERAPRED UNI-PAK 21 TAB) 10 MG (21) TBPK tablet Take by mouth daily. Take 6 tabs by mouth daily  for 2 days, then 5 tabs for 2 days, then 4 tabs for 2 days, then 3 tabs for 2 days, 2 tabs for 2 days, then 1 tab by mouth daily for 2 days 05/14/21  Yes Sukaina Toothaker,  Capri Veals, PA-C  cetirizine (ZYRTEC) 10 MG tablet Take 10 mg by mouth daily.    [provider]  ciprofloxacin (CIPRO) 500 MG tablet Take 1 tablet (500 mg total) by mouth 2 (two) times daily. 12/29/13   Reyne Dumas, MD  docusate sodium (COLACE) 100 MG capsule Take 100 mg by mouth 2 (two) times daily.    [provider]  loratadine (CLARITIN) 10 MG tablet Take 10 mg by mouth daily as needed. For allergies     [provider]  meloxicam (MOBIC) 15 MG tablet Take 15 mg by mouth daily.    [provider]  oxyCODONE-acetaminophen (PERCOCET) 5-325 MG per tablet Take 1 tablet by mouth every 4 (four) hours as needed.    [provider]    Family History Family History  Problem Relation Age of Onset   Breast cancer Mother     Social History Social History   Tobacco Use   Smoking status: Never   Smokeless tobacco: Never  Vaping Use   Vaping Use: Never used  Substance  Use Topics   Alcohol use: No   Drug use: No     Allergies   Patient has no known allergies.   Review of Systems Review of Systems  Constitutional:  Negative for chills and fever.  HENT:  Negative for ear pain and sore throat.   Eyes:  Negative for pain and visual disturbance.  Respiratory:  Negative for cough and shortness of breath.   Cardiovascular:  Negative for chest pain and palpitations.  Gastrointestinal:  Negative for abdominal pain and vomiting.  Genitourinary:  Negative for dysuria and hematuria.  Musculoskeletal:  Positive for arthralgias (right hip pain). Negative for back pain.  Skin:  Negative for color change and rash.  Neurological:  Negative for seizures and syncope.  All other systems reviewed and are negative.   Physical Exam Triage Vital Signs ED Triage Vitals  Enc Vitals Group     BP 05/14/21 1336 133/81     Pulse Rate 05/14/21 1336 69     Resp 05/14/21 1336 18     Temp 05/14/21 1336 (!) 97.2 F (36.2 C)     Temp Source 05/14/21 1336 Temporal      SpO2 05/14/21 1336 99 %     Weight --      Height --      Head Circumference --      Peak Flow --      Pain Score 05/14/21 1337 8     Pain Loc --      Pain Edu? --      Excl. in [place]? --    No data found.  Updated Vital Signs BP 133/81   Pulse 69   Temp (!) 97.2 F (36.2 C) (Temporal)   Resp 18   LMP 11/17/2011   SpO2 99%   Visual Acuity Right Eye Distance:   Left Eye Distance:   Bilateral Distance:    Right Eye Near:   Left Eye Near:    Bilateral Near:     Physical Exam Vitals and nursing note reviewed.  Constitutional:      General: She is not in acute distress.    Appearance: She is well-developed.  HENT:     Head: Normocephalic and atraumatic.  Eyes:     Conjunctiva/sclera: Conjunctivae normal.  Cardiovascular:     Rate and Rhythm: Normal rate and regular rhythm.     Heart sounds: No murmur heard. Pulmonary:     Effort: Pulmonary effort is normal. No respiratory distress.     Breath sounds: Normal breath sounds.  Abdominal:     Palpations: Abdomen is soft.     Tenderness: There is no abdominal tenderness.  Musculoskeletal:     Cervical back: Neck supple.     Right hip: Normal range of motion. Normal strength.  Skin:    General: Skin is warm and dry.  Neurological:     Mental Status: She is alert.     UC Treatments / Results  Labs (all labs ordered are listed, but only abnormal results are displayed) Labs Reviewed - No data to display  EKG   Radiology No results found.  Procedures Procedures (including critical care time)  Medications Ordered in UC Medications - No data to display  Initial Impression / Assessment and Plan / UC Course  I have reviewed the triage vital signs and the nursing notes.  Pertinent labs & imaging results that were available during my care of the patient were reviewed by me and considered in my medical decision making (see  chart for details).     Symptoms and exam consistent with right greater trochanter  bursitis.  Advised ice and stretching. Will start prednisone.  Advised follow up with orthopedics if no improvement for potential steroid injection/PT.  Final Clinical Impressions(s) / UC Diagnoses   Final diagnoses:  Greater trochanteric bursitis of right hip     Discharge Instructions      Take medication as prescribed Apply ice to affected areas Stretch, follow instructions provided  If no improvement follow up with orthopedics   ED Prescriptions     Medication Sig Dispense Auth. Provider   predniSONE (STERAPRED UNI-PAK 21 TAB) 10 MG (21) TBPK tablet Take by mouth daily. Take 6 tabs by mouth daily  for 2 days, then 5 tabs for 2 days, then 4 tabs for 2 days, then 3 tabs for 2 days, 2 tabs for 2 days, then 1 tab by mouth daily for 2 days 42 tablet Kj Imbert, PA-C      PDMP not reviewed this encounter.   Konrad Felix, PA-C 05/26/21 1553

## 2021-05-14 NOTE — Discharge Instructions (Addendum)
Take medication as prescribed Apply ice to affected areas Stretch, follow instructions provided  If no improvement follow up with orthopedics

## 2021-05-20 ENCOUNTER — Encounter: Payer: Self-pay | Admitting: Family Medicine

## 2021-05-20 ENCOUNTER — Ambulatory Visit (INDEPENDENT_AMBULATORY_CARE_PROVIDER_SITE_OTHER): Payer: 59 | Admitting: Family Medicine

## 2021-05-20 ENCOUNTER — Other Ambulatory Visit: Payer: Self-pay

## 2021-05-20 VITALS — BP 126/74 | HR 76 | Temp 97.2°F | Ht <= 58 in | Wt 196.6 lb

## 2021-05-20 DIAGNOSIS — G8929 Other chronic pain: Secondary | ICD-10-CM | POA: Diagnosis not present

## 2021-05-20 DIAGNOSIS — Z0000 Encounter for general adult medical examination without abnormal findings: Secondary | ICD-10-CM

## 2021-05-20 DIAGNOSIS — M25551 Pain in right hip: Secondary | ICD-10-CM

## 2021-05-20 MED ORDER — MELOXICAM 7.5 MG PO TABS: 7.5000 mg | ORAL_TABLET | Freq: Every day | ORAL | 0 refills | Status: DC

## 2021-05-20 NOTE — Progress Notes (Signed)
New Patient Office Visit  Subjective:  Patient ID: Lynn Robinson, female    DOB: 01/20/1967  Age: 50 y.o. MRN: [id number]  CC:  Chief Complaint  Patient presents with   Establish Care    NP/establish care, C/O leg and hip pain x 1 week seen at urgent care Rx given still in lots of pain.     HPI Lynn Robinson presents for establishment of care for right hip pain.  This is been present for the last few years.  There was no specific injury.  Pain is in her proximal lateral hip and buttock area.  Occasionally radiates down the side of her leg.  She cannot cross her right leg over her left.  Denies specific back pain.  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  She was seen in urgent care.  She was given a prednisone Dosepak.  She is not tolerating this medicine well.  She has a history of prediabetes.  She requests referral to GYN care.  She has been using Lexapro as needed for anxiety and would like to go ahead and taper off of it and asked me how to use it.  She uses it approximately every 3 days when she does take.  Past Medical History:  Diagnosis Date   Anxiety    Fibroids    GERD (gastroesophageal reflux disease)    occasional reflux-no meds   Hypercholesteremia    Seasonal allergies    Shortness of breath    on exertion only    Past Surgical History:  Procedure Laterality Date   ABDOMINAL HYSTERECTOMY  12/06/2011   Procedure: HYSTERECTOMY ABDOMINAL;  Surgeon: Mora Bellman, MD;  Location: [facility];  Service: Gynecology;  Laterality: N/A;   CYSTOSCOPY  12/06/2011   Procedure: CYSTOSCOPY;  Surgeon: Mora Bellman, MD;  Location: [facility];  Service: Gynecology;;   TUBAL LIGATION      Family History  Problem Relation Age of Onset   Breast cancer Mother     Social History   Socioeconomic History   Marital status: Married    Spouse name: Not on file   Number of children: Not on file   Years of education: Not on file   Highest education level: Not on  file  Occupational History   Not on file  Tobacco Use   Smoking status: Never   Smokeless tobacco: Never  Vaping Use   Vaping Use: Never used  Substance and Sexual Activity   Alcohol use: No   Drug use: No   Sexual activity: Not on file  Other Topics Concern   Not on file  Social History Narrative   Not on file   Social Determinants of Health   Financial Resource Strain: Not on file  Food Insecurity: Not on file  Transportation Needs: Not on file  Physical Activity: Not on file  Stress: Not on file  Social Connections: Not on file  Intimate Partner Violence: Not on file    ROS Review of Systems  Constitutional: Negative.   HENT: Negative.    Respiratory: Negative.    Cardiovascular: Negative.   Gastrointestinal: Negative.   Musculoskeletal:  Positive for arthralgias. Negative for back pain.  Skin: Negative.   Neurological:  Negative for weakness and numbness.  Psychiatric/Behavioral:  Negative for dysphoric mood.    Objective:   Today's Vitals: BP 126/74   Pulse 76   Temp (!) 97.2 F (36.2 C) (Temporal)   Ht 4\' 10"  (1.473 m)  Wt 196 lb 9.6 oz (89.2 kg)   LMP 11/17/2011   SpO2 98%   BMI 41.09 kg/m   Physical Exam Vitals and nursing note reviewed.  Constitutional:      General: She is not in acute distress.    Appearance: Normal appearance. She is obese. She is not ill-appearing, toxic-appearing or diaphoretic.  HENT:     Head: Normocephalic and atraumatic.     Right Ear: External ear normal.     Left Ear: External ear normal.  Pulmonary:     Effort: Pulmonary effort is normal.  Musculoskeletal:     Lumbar back: No spasms, tenderness or bony tenderness. Normal range of motion. Negative right straight leg raise test and negative left straight leg raise test.     Right hip: Tenderness present. Decreased range of motion. Normal strength.  Neurological:     Mental Status: She is oriented to person, place, and time.  Psychiatric:        Mood and Affect:  Mood normal.        Behavior: Behavior normal.    Assessment & Plan:   Problem List Items Addressed This Visit       Other   Healthcare maintenance - Primary   Relevant Orders   CBC   Comprehensive metabolic panel   LDL cholesterol, direct   Hemoglobin A1c   Lipid panel   Urinalysis, Routine w reflex microscopic   Ambulatory referral to Gynecology   Hip pain, chronic, right   Relevant Medications   meloxicam (MOBIC) 7.5 MG tablet   Other Relevant Orders   Ambulatory referral to Sports Medicine   DG Hip Unilat W OR W/O Pelvis 2-3 Views Right   DG Hip Unilat W OR W/O Pelvis 1V Left    Outpatient Encounter Medications as of 05/20/2021  Medication Sig   cetirizine (ZYRTEC) 10 MG tablet Take 10 mg by mouth daily.   meloxicam (MOBIC) 7.5 MG tablet Take 1 tablet (7.5 mg total) by mouth daily. With a meal   oxyCODONE-acetaminophen (PERCOCET) 5-325 MG per tablet Take 1 tablet by mouth every 4 (four) hours as needed.   [DISCONTINUED] ESCITALOPRAM OXALATE PO Take by mouth.   [DISCONTINUED] predniSONE (STERAPRED UNI-PAK 21 TAB) 10 MG (21) TBPK tablet Take by mouth daily. Take 6 tabs by mouth daily  for 2 days, then 5 tabs for 2 days, then 4 tabs for 2 days, then 3 tabs for 2 days, 2 tabs for 2 days, then 1 tab by mouth daily for 2 days   [DISCONTINUED] ciprofloxacin (CIPRO) 500 MG tablet Take 1 tablet (500 mg total) by mouth 2 (two) times daily. (Patient not taking: Reported on 05/20/2021)   [DISCONTINUED] docusate sodium (COLACE) 100 MG capsule Take 100 mg by mouth 2 (two) times daily.   [DISCONTINUED] loratadine (CLARITIN) 10 MG tablet Take 10 mg by mouth daily as needed. For allergies    [DISCONTINUED] meloxicam (MOBIC) 15 MG tablet Take 15 mg by mouth daily.   No facility-administered encounter medications on file as of 05/20/2021.    Follow-up: Return in about 3 months (around 08/20/2021).   Fasting labs drawn today.  We will go for x-rays of her hip.  We will start low-dose  meloxicam daily.  We will discontinue prednisone.  Fasting labs drawn today.  Discussed taper of Lexapro.  We will follow-up in 3 months.  Libby Maw, MD

## 2021-05-23 ENCOUNTER — Ambulatory Visit (INDEPENDENT_AMBULATORY_CARE_PROVIDER_SITE_OTHER)
Admission: RE | Admit: 2021-05-23 | Discharge: 2021-05-23 | Disposition: A | Discharge status: Home / Self Care | Payer: 59 | Source: Ambulatory Visit | Attending: Family Medicine | Admitting: Family Medicine

## 2021-05-23 ENCOUNTER — Other Ambulatory Visit: Payer: Self-pay

## 2021-05-23 DIAGNOSIS — M25551 Pain in right hip: Secondary | ICD-10-CM

## 2021-05-23 DIAGNOSIS — G8929 Other chronic pain: Secondary | ICD-10-CM

## 2021-06-07 ENCOUNTER — Ambulatory Visit: Payer: 59 | Admitting: Family Medicine

## 2021-06-07 ENCOUNTER — Other Ambulatory Visit: Payer: Self-pay

## 2021-06-07 ENCOUNTER — Encounter: Payer: Self-pay | Admitting: Family Medicine

## 2021-06-07 VITALS — BP 112/82 | Ht <= 58 in | Wt 196.0 lb

## 2021-06-07 DIAGNOSIS — M25851 Other specified joint disorders, right hip: Secondary | ICD-10-CM

## 2021-06-07 NOTE — Patient Instructions (Signed)
Nice to meet you Please try heat  Please try the exercises  Please try the physical therapy  You can take mobic twice daily if needed  Please send me a message in MyChart with any questions or updates.  Please see me back in 4 weeks.   --Dr. Carlynn Herald de conocerte Por favor, prueba con calor Por favor, prueba los ejercicios. Prueba la fisioterapia Puede tomar [employer] veces al da si es necesario Enveme un mensaje en MyChart con cualquier pregunta o actualizacin. Por favor, vuelve a verme en 4 semanas.

## 2021-06-07 NOTE — Progress Notes (Signed)
  Lynn Robinson - 50 y.o. female MRN [id number]  Date of birth: 08-Apr-1971  SUBJECTIVE:  Including CC & ROS.  No chief complaint on file.   Lynn Robinson is a 50 y.o. female that is presenting with acute on chronic bilateral posterior hip pain.  The right is worse than the left.  She also notes some pain down the leg and into the foot.  Seems to be worse with pushing and bending repetitive motions.  Has gotten improvement with the meloxicam..  Independent review of the bilateral hip x-ray from 6/27 shows no significant degenerative change of the joints.   Review of Systems See HPI   HISTORY: Past Medical, Surgical, Social, and Family History Reviewed & Updated per EMR.   Pertinent Historical Findings include:  Past Medical History:  Diagnosis Date   Anxiety    Fibroids    GERD (gastroesophageal reflux disease)    occasional reflux-no meds   Hypercholesteremia    Seasonal allergies    Shortness of breath    on exertion only    Past Surgical History:  Procedure Laterality Date   ABDOMINAL HYSTERECTOMY  12/06/2011   Procedure: HYSTERECTOMY ABDOMINAL;  Surgeon: Mora Bellman, MD;  Location: [facility];  Service: Gynecology;  Laterality: N/A;   CYSTOSCOPY  12/06/2011   Procedure: CYSTOSCOPY;  Surgeon: Mora Bellman, MD;  Location: [facility];  Service: Gynecology;;   TUBAL LIGATION      Family History  Problem Relation Age of Onset   Breast cancer Mother     Social History   Socioeconomic History   Marital status: Married    Spouse name: Not on file   Number of children: Not on file   Years of education: Not on file   Highest education level: Not on file  Occupational History   Not on file  Tobacco Use   Smoking status: Never   Smokeless tobacco: Never  Vaping Use   Vaping Use: Never used  Substance and Sexual Activity   Alcohol use: No   Drug use: No   Sexual activity: Not on file  Other Topics Concern   Not on file  Social History Narrative   Not on file    Social Determinants of Health   Financial Resource Strain: Not on file  Food Insecurity: Not on file  Transportation Needs: Not on file  Physical Activity: Not on file  Stress: Not on file  Social Connections: Not on file  Intimate Partner Violence: Not on file     PHYSICAL EXAM:  VS: BP 112/82 (BP Location: Left Arm, Patient Position: Sitting, Cuff Size: Large)   Ht 4\' 10"  (1.473 m)   Wt 196 lb (88.9 kg)   LMP 11/17/2011   BMI 40.96 kg/m  Physical Exam Gen: NAD, alert, cooperative with exam, well-appearing MSK:  Right hip: Normal strength resistance. Normal 1 leg standing. Instability with hip flexion and abduction. Weakness with hip abduction. Neurovascularly intact     ASSESSMENT & PLAN:   Hip impingement syndrome, right Symptoms seem more consistent with impingement of the posterior hip.  Does not appear to be related to her joint space -Counseled on home exercise therapy and supportive care. -Counseled on meloxicam. -Referral to physical therapy. -Could consider injection

## 2021-06-07 NOTE — Assessment & Plan Note (Signed)
Symptoms seem more consistent with impingement of the posterior hip.  Does not appear to be related to her joint space -Counseled on home exercise therapy and supportive care. -Counseled on meloxicam. -Referral to physical therapy. -Could consider injection

## 2021-07-13 ENCOUNTER — Ambulatory Visit: Payer: Self-pay | Admitting: Family Medicine

## 2021-07-13 ENCOUNTER — Other Ambulatory Visit: Payer: Self-pay

## 2021-07-13 NOTE — Progress Notes (Deleted)
  Lynn Robinson - 50 y.o. female MRN [id number]  Date of birth: 10/20/1971  SUBJECTIVE:  Including CC & ROS.  No chief complaint on file.   Lynn Robinson is a 50 y.o. female that is  ***.  ***   Review of Systems See HPI   HISTORY: Past Medical, Surgical, Social, and Family History Reviewed & Updated per EMR.   Pertinent Historical Findings include:  Past Medical History:  Diagnosis Date   Anxiety    Fibroids    GERD (gastroesophageal reflux disease)    occasional reflux-no meds   Hypercholesteremia    Seasonal allergies    Shortness of breath    on exertion only    Past Surgical History:  Procedure Laterality Date   ABDOMINAL HYSTERECTOMY  12/06/2011   Procedure: HYSTERECTOMY ABDOMINAL;  Surgeon: Mora Bellman, MD;  Location: [facility];  Service: Gynecology;  Laterality: N/A;   CYSTOSCOPY  12/06/2011   Procedure: CYSTOSCOPY;  Surgeon: Mora Bellman, MD;  Location: [facility];  Service: Gynecology;;   TUBAL LIGATION      Family History  Problem Relation Age of Onset   Breast cancer Mother     Social History   Socioeconomic History   Marital status: Married    Spouse name: Not on file   Number of children: Not on file   Years of education: Not on file   Highest education level: Not on file  Occupational History   Not on file  Tobacco Use   Smoking status: Never   Smokeless tobacco: Never  Vaping Use   Vaping Use: Never used  Substance and Sexual Activity   Alcohol use: No   Drug use: No   Sexual activity: Not on file  Other Topics Concern   Not on file  Social History Narrative   Not on file   Social Determinants of Health   Financial Resource Strain: Not on file  Food Insecurity: Not on file  Transportation Needs: Not on file  Physical Activity: Not on file  Stress: Not on file  Social Connections: Not on file  Intimate Partner Violence: Not on file     PHYSICAL EXAM:  VS: LMP 11/17/2011  Physical Exam Gen: NAD, alert, cooperative with  exam, well-appearing MSK:  ***      ASSESSMENT & PLAN:   No problem-specific Assessment & Plan notes found for this encounter.

## 2021-08-22 ENCOUNTER — Other Ambulatory Visit: Payer: Self-pay

## 2021-08-22 ENCOUNTER — Encounter: Payer: Self-pay | Admitting: Family Medicine

## 2021-08-22 ENCOUNTER — Ambulatory Visit (INDEPENDENT_AMBULATORY_CARE_PROVIDER_SITE_OTHER): Payer: 59 | Admitting: Family Medicine

## 2021-08-22 ENCOUNTER — Other Ambulatory Visit (HOSPITAL_COMMUNITY)
Admission: RE | Admit: 2021-08-22 | Discharge: 2021-08-22 | Disposition: A | Discharge status: Home / Self Care | Payer: 59 | Source: Ambulatory Visit | Attending: Family Medicine | Admitting: Family Medicine

## 2021-08-22 VITALS — BP 128/88 | HR 64 | Temp 97.7°F | Resp 18 | Wt 201.0 lb

## 2021-08-22 DIAGNOSIS — N390 Urinary tract infection, site not specified: Secondary | ICD-10-CM

## 2021-08-22 DIAGNOSIS — B3731 Acute candidiasis of vulva and vagina: Secondary | ICD-10-CM

## 2021-08-22 DIAGNOSIS — H60503 Unspecified acute noninfective otitis externa, bilateral: Secondary | ICD-10-CM | POA: Diagnosis not present

## 2021-08-22 DIAGNOSIS — Z23 Encounter for immunization: Secondary | ICD-10-CM

## 2021-08-22 DIAGNOSIS — Z0000 Encounter for general adult medical examination without abnormal findings: Secondary | ICD-10-CM

## 2021-08-22 DIAGNOSIS — B373 Candidiasis of vulva and vagina: Secondary | ICD-10-CM | POA: Diagnosis not present

## 2021-08-22 DIAGNOSIS — J301 Allergic rhinitis due to pollen: Secondary | ICD-10-CM

## 2021-08-22 LAB — COMPREHENSIVE METABOLIC PANEL
ALT: 12 U/L (ref 0–35)
AST: 12 U/L (ref 0–37)
Albumin: 4.2 g/dL (ref 3.5–5.2)
Alkaline Phosphatase: 67 U/L (ref 39–117)
BUN: 14 mg/dL (ref 6–23)
CO2: 29 mEq/L (ref 19–32)
Calcium: 8.3 mg/dL — ABNORMAL LOW (ref 8.4–10.5)
Chloride: 103 mEq/L (ref 96–112)
Creatinine, Ser: 0.57 mg/dL (ref 0.40–1.20)
GFR: 107.34 mL/min (ref >60.00)
Glucose, Bld: 86 mg/dL (ref 70–99)
Potassium: 4 mEq/L (ref 3.5–5.1)
Sodium: 138 mEq/L (ref 135–145)
Total Bilirubin: 0.5 mg/dL (ref 0.2–1.2)
Total Protein: 6.5 g/dL (ref 6.0–8.3)

## 2021-08-22 LAB — URINALYSIS, ROUTINE W REFLEX MICROSCOPIC
Bilirubin Urine: NEGATIVE
Hgb urine dipstick: NEGATIVE
Ketones, ur: NEGATIVE
Leukocytes,Ua: NEGATIVE
Nitrite: NEGATIVE
RBC / HPF: NONE SEEN (ref 0–?)
Specific Gravity, Urine: 1.015 (ref 1.000–1.030)
Total Protein, Urine: NEGATIVE
Urine Glucose: NEGATIVE
Urobilinogen, UA: 0.2 (ref 0.0–1.0)
pH: 6.5 (ref 5.0–8.0)

## 2021-08-22 LAB — LIPID PANEL
Cholesterol: 207 mg/dL — ABNORMAL HIGH (ref 0–200)
HDL: 42.9 mg/dL (ref >39.00)
LDL Cholesterol: 137 mg/dL — ABNORMAL HIGH (ref 0–99)
NonHDL: 164.33
Total CHOL/HDL Ratio: 5
Triglycerides: 137 mg/dL (ref 0.0–149.0)
VLDL: 27.4 mg/dL (ref 0.0–40.0)

## 2021-08-22 LAB — CBC
HCT: 39.2 % (ref 36.0–46.0)
Hemoglobin: 13.1 g/dL (ref 12.0–15.0)
MCHC: 33.3 g/dL (ref 30.0–36.0)
MCV: 90 fl (ref 78.0–100.0)
Platelets: 301 10*3/uL (ref 150.0–400.0)
RBC: 4.36 Mil/uL (ref 3.87–5.11)
RDW: 14.7 % (ref 11.5–15.5)
WBC: 6.3 10*3/uL (ref 4.0–10.5)

## 2021-08-22 LAB — HEMOGLOBIN A1C: Hgb A1c MFr Bld: 5.7 % (ref 4.6–6.5)

## 2021-08-22 MED ORDER — MOMETASONE FUROATE 50 MCG/ACT NA SUSP: 2.0000 | Freq: Every day | NASAL | 12 refills | Status: DC

## 2021-08-22 MED ORDER — NEOMYCIN-POLYMYXIN-HC 3.5-10000-1 OT SUSP: 3.0000 [drp] | Freq: Three times a day (TID) | OTIC | 0 refills | Status: DC

## 2021-08-22 NOTE — Progress Notes (Addendum)
Established Patient Office Visit  Subjective:  Patient ID: Lynn Robinson, female    DOB: 24-Nov-1971  Age: 50 y.o. MRN: [id number]  CC:  Chief Complaint  Patient presents with   Follow-up    3 mo follow up, pt states all is well but feels a little nauseous, pt states she feels pain with urination frequency and urgency     HPI Lynn Robinson presents for follow-up of her health maintenance labs, ongoing sneeze with scratchy throat nonproductive cough.  Denies fever or chills.  No difficulty breathing.  She believes that she may have a urinary tract infection.  There is mild dysuria and frequency no discharge.  History of prediabetes.  She has never taken medicines for this.  Hip is doing better with the meloxicam.  Physical therapy has been recommended.  She is unable to go due to her work schedule.  She is doing yoga and this is helping.  Unfortunately her labs were never resulted from last visit.  They were drawn but did not result.  She tells of discomfort in both ears.  Hearing is not affected.  Discomfort seems to be in the canals.  Appointment scheduled interpreter service was used.  Past Medical History:  Diagnosis Date   Anxiety    Fibroids    GERD (gastroesophageal reflux disease)    occasional reflux-no meds   Hypercholesteremia    Seasonal allergies    Shortness of breath    on exertion only    Past Surgical History:  Procedure Laterality Date   ABDOMINAL HYSTERECTOMY  12/06/2011   Procedure: HYSTERECTOMY ABDOMINAL;  Surgeon: Mora Bellman, MD;  Location: [facility];  Service: Gynecology;  Laterality: N/A;   CYSTOSCOPY  12/06/2011   Procedure: CYSTOSCOPY;  Surgeon: Mora Bellman, MD;  Location: [facility];  Service: Gynecology;;   TUBAL LIGATION      Family History  Problem Relation Age of Onset   Breast cancer Mother     Social History   Socioeconomic History   Marital status: Married    Spouse name: Not on file   Number of children: Not on file   Years of  education: Not on file   Highest education level: Not on file  Occupational History   Not on file  Tobacco Use   Smoking status: Never   Smokeless tobacco: Never  Vaping Use   Vaping Use: Never used  Substance and Sexual Activity   Alcohol use: No   Drug use: No   Sexual activity: Not on file  Other Topics Concern   Not on file  Social History Narrative   Not on file   Social Determinants of Health   Financial Resource Strain: Not on file  Food Insecurity: Not on file  Transportation Needs: Not on file  Physical Activity: Not on file  Stress: Not on file  Social Connections: Not on file  Intimate Partner Violence: Not on file    Outpatient Medications Prior to Visit  Medication Sig Dispense Refill   cetirizine (ZYRTEC) 10 MG tablet Take 10 mg by mouth daily. (Patient not taking: Reported on 08/22/2021)     meloxicam (MOBIC) 7.5 MG tablet Take 1 tablet (7.5 mg total) by mouth daily. With a meal (Patient not taking: Reported on 08/22/2021) 30 tablet 0   oxyCODONE-acetaminophen (PERCOCET) 5-325 MG per tablet Take 1 tablet by mouth every 4 (four) hours as needed. (Patient not taking: Reported on 08/22/2021)     No facility-administered medications prior to visit.  No Known Allergies  ROS Review of Systems  Constitutional:  Negative for diaphoresis, fatigue, fever and unexpected weight change.  HENT:  Positive for congestion, ear pain and sneezing. Negative for ear discharge and hearing loss.   Eyes:  Negative for photophobia and visual disturbance.  Respiratory:  Positive for cough. Negative for shortness of breath and wheezing.   Cardiovascular: Negative.   Gastrointestinal: Negative.   Endocrine: Negative for polyphagia and polyuria.  Genitourinary:  Positive for dysuria and frequency. Negative for urgency and vaginal discharge.  Neurological:  Negative for speech difficulty and weakness.     Objective:    Physical Exam Vitals and nursing note reviewed.   Constitutional:      General: She is not in acute distress.    Appearance: Normal appearance. She is not ill-appearing, toxic-appearing or diaphoretic.  HENT:     Head: Normocephalic and atraumatic.     Right Ear: Tympanic membrane and external ear normal. Swelling and tenderness present.     Left Ear: Tympanic membrane and external ear normal. Swelling and tenderness present.     Mouth/Throat:     Mouth: Mucous membranes are moist.     Pharynx: Oropharynx is clear. No oropharyngeal exudate.  Eyes:     General: No scleral icterus.       Right eye: No discharge.        Left eye: No discharge.     Extraocular Movements: Extraocular movements intact.     Conjunctiva/sclera: Conjunctivae normal.     Pupils: Pupils are equal, round, and reactive to light.  Cardiovascular:     Rate and Rhythm: Normal rate and regular rhythm.  Pulmonary:     Effort: Pulmonary effort is normal. No respiratory distress.     Breath sounds: Normal breath sounds. No stridor. No wheezing, rhonchi or rales.  Musculoskeletal:     Cervical back: No rigidity or tenderness.  Lymphadenopathy:     Cervical: No cervical adenopathy.  Skin:    General: Skin is warm and dry.  Neurological:     Mental Status: She is alert and oriented to person, place, and time.  Psychiatric:        Mood and Affect: Mood normal.        Behavior: Behavior normal.    BP 128/88 (BP Location: Left Arm, Patient Position: Sitting, Cuff Size: Large)   Pulse 64   Temp 97.7 F (36.5 C) (Temporal)   Resp 18   Wt 201 lb (91.2 kg)   LMP 11/17/2011   SpO2 99%   BMI 42.01 kg/m  Wt Readings from Last 3 Encounters:  08/22/21 201 lb (91.2 kg)  06/07/21 196 lb (88.9 kg)  05/20/21 196 lb 9.6 oz (89.2 kg)     Health Maintenance Due  Topic Date Due   Hepatitis C Screening  Never done   TETANUS/TDAP  Never done   PAP SMEAR-Modifier  10/10/2014   COLONOSCOPY (Pts 45-62yrs Insurance coverage will need to be confirmed)  Never done     There are no preventive care reminders to display for this patient.  No results found for: TSH Lab Results  Component Value Date   WBC 6.3 08/22/2021   HGB 13.1 08/22/2021   HCT 39.2 08/22/2021   MCV 90.0 08/22/2021   PLT 301.0 08/22/2021   Lab Results  Component Value Date   NA 138 08/22/2021   K 4.0 08/22/2021   CO2 29 08/22/2021   GLUCOSE 86 08/22/2021   BUN 14 08/22/2021   CREATININE  0.57 08/22/2021   BILITOT 0.5 08/22/2021   ALKPHOS 67 08/22/2021   AST 12 08/22/2021   ALT 12 08/22/2021   PROT 6.5 08/22/2021   ALBUMIN 4.2 08/22/2021   CALCIUM 8.3 (L) 08/22/2021   GFR 107.34 08/22/2021   Lab Results  Component Value Date   CHOL 207 (H) 08/22/2021   Lab Results  Component Value Date   HDL 42.90 08/22/2021   Lab Results  Component Value Date   LDLCALC 137 (H) 08/22/2021   Lab Results  Component Value Date   TRIG 137.0 08/22/2021   Lab Results  Component Value Date   CHOLHDL 5 08/22/2021   Lab Results  Component Value Date   HGBA1C 5.7 08/22/2021      Assessment & Plan:   Problem List Items Addressed This Visit       Respiratory   Allergic rhinitis due to pollen   Relevant Medications   mometasone (NASONEX) 50 MCG/ACT nasal spray     Genitourinary   UTI (urinary tract infection)   Relevant Medications   fluconazole (DIFLUCAN) 150 MG tablet   Other Relevant Orders   Urinalysis, Routine w reflex microscopic (Completed)   Urine Culture   Urine cytology ancillary only (Completed)   Yeast vaginitis   Relevant Medications   fluconazole (DIFLUCAN) 150 MG tablet     Other   Healthcare maintenance - Primary   Relevant Orders   CBC (Completed)   Comprehensive metabolic panel (Completed)   Lipid panel (Completed)   Hemoglobin A1c (Completed)   HIV Antibody (routine testing w rflx) (Completed)   Flu Vaccine QUAD 6+ mos PF IM (Fluarix Quad PF) (Completed)   Other Visit Diagnoses     Acute otitis externa of both ears, unspecified type        Relevant Medications   neomycin-polymyxin-hydrocortisone (CORTISPORIN) 3.5-10000-1 OTIC suspension       Meds ordered this encounter  Medications   mometasone (NASONEX) 50 MCG/ACT nasal spray    Sig: Place 2 sprays into the nose daily.    Dispense:  1 each    Refill:  12   neomycin-polymyxin-hydrocortisone (CORTISPORIN) 3.5-10000-1 OTIC suspension    Sig: Place 3 drops into both ears 3 (three) times daily.    Dispense:  10 mL    Refill:  0   fluconazole (DIFLUCAN) 150 MG tablet    Sig: Take 1 tablet (150 mg total) by mouth once for 1 dose.    Dispense:  1 tablet    Refill:  0    Follow-up: Return in about 3 months (around 11/21/2021), or if symptoms worsen or fail to improve.   Flu shot today.  Redrawing labs today.  Please use Nasonex daily.  Eardrops for 5 days and then stop.  Follow-up in 3 months or sooner if needed. Libby Maw, MD

## 2021-08-23 LAB — URINE CYTOLOGY ANCILLARY ONLY: Candida Urine: POSITIVE — AB

## 2021-08-24 DIAGNOSIS — B3731 Acute candidiasis of vulva and vagina: Secondary | ICD-10-CM | POA: Insufficient documentation

## 2021-08-24 DIAGNOSIS — J301 Allergic rhinitis due to pollen: Secondary | ICD-10-CM | POA: Insufficient documentation

## 2021-08-24 DIAGNOSIS — B373 Candidiasis of vulva and vagina: Secondary | ICD-10-CM | POA: Insufficient documentation

## 2021-08-24 MED ORDER — FLUCONAZOLE 150 MG PO TABS: 150.0000 mg | ORAL_TABLET | Freq: Once | ORAL | 0 refills | Status: AC

## 2021-08-24 NOTE — Addendum Note (Signed)
Addended by: Jon Billings on: 08/24/2021 07:42 AM   Modules accepted: Orders

## 2021-08-25 LAB — URINE CULTURE
MICRO NUMBER:: 12422374
SPECIMEN QUALITY:: ADEQUATE

## 2021-08-25 LAB — HIV ANTIBODY (ROUTINE TESTING W REFLEX): HIV 1&2 Ab, 4th Generation: NONREACTIVE

## 2021-08-31 ENCOUNTER — Other Ambulatory Visit: Payer: Self-pay

## 2021-08-31 ENCOUNTER — Encounter: Payer: Self-pay | Admitting: Obstetrics & Gynecology

## 2021-08-31 ENCOUNTER — Ambulatory Visit: Payer: 59 | Admitting: Obstetrics & Gynecology

## 2021-08-31 VITALS — BP 112/80 | HR 74 | Resp 16 | Ht 58.25 in | Wt 200.0 lb

## 2021-08-31 DIAGNOSIS — N898 Other specified noninflammatory disorders of vagina: Secondary | ICD-10-CM

## 2021-08-31 DIAGNOSIS — Z01419 Encounter for gynecological examination (general) (routine) without abnormal findings: Secondary | ICD-10-CM

## 2021-08-31 DIAGNOSIS — N951 Menopausal and female climacteric states: Secondary | ICD-10-CM | POA: Diagnosis not present

## 2021-08-31 DIAGNOSIS — N766 Ulceration of vulva: Secondary | ICD-10-CM

## 2021-08-31 DIAGNOSIS — N9089 Other specified noninflammatory disorders of vulva and perineum: Secondary | ICD-10-CM | POA: Diagnosis not present

## 2021-08-31 DIAGNOSIS — Z90710 Acquired absence of both cervix and uterus: Secondary | ICD-10-CM

## 2021-08-31 DIAGNOSIS — Z6841 Body Mass Index (BMI) 40.0 and over, adult: Secondary | ICD-10-CM

## 2021-08-31 MED ORDER — FLUCONAZOLE 150 MG PO TABS: 150.0000 mg | ORAL_TABLET | Freq: Every day | ORAL | 7 refills | Status: AC

## 2021-08-31 MED ORDER — CLOBETASOL PROPIONATE 0.05 % EX OINT: 1.0000 "application " | TOPICAL_OINTMENT | Freq: Every day | CUTANEOUS | 4 refills | Status: AC

## 2021-08-31 NOTE — Progress Notes (Signed)
Lynn Robinson 07-24-1971 [id number]   History:    50 y.o. G3P3L3 married  RP:  New patient presenting for annual gyn exam   HPI: S/P Total Hysterectomy for Fibroids.  C/O vaginal itching and vulvar burning left > right.  Itching below the breasts and at the back as well.  No pelvic pain.  No pain with IC.  Breasts normal.  Urine/BMs normal.  Depression controled on Lexapro.  Health labs with Fam MD.  BMI 41.44.  Past medical history,surgical history, family history and social history were all reviewed and documented in the EPIC chart.  Gynecologic History Patient's last menstrual period was 11/17/2011. S/P Hysterectomy  Obstetric History OB History  Gravida Para Term Preterm AB Living  3 3 3     3   SAB IAB Ectopic Multiple Live Births               # Outcome Date GA Lbr Len/2nd Weight Sex Delivery Anes PTL Lv  3 Term           2 Term           1 Term              ROS: A ROS was performed and pertinent positives and negatives are included in the history.  GENERAL: No fevers or chills. HEENT: No change in vision, no earache, sore throat or sinus congestion. NECK: No pain or stiffness. CARDIOVASCULAR: No chest pain or pressure. No palpitations. PULMONARY: No shortness of breath, cough or wheeze. GASTROINTESTINAL: No abdominal pain, nausea, vomiting or diarrhea, melena or bright red blood per rectum. GENITOURINARY: No urinary frequency, urgency, hesitancy or dysuria. MUSCULOSKELETAL: No joint or muscle pain, no back pain, no recent trauma. DERMATOLOGIC: No rash, no itching, no lesions. ENDOCRINE: No polyuria, polydipsia, no heat or cold intolerance. No recent change in weight. HEMATOLOGICAL: No anemia or easy bruising or bleeding. NEUROLOGIC: No headache, seizures, numbness, tingling or weakness. PSYCHIATRIC: No depression, no loss of interest in normal activity or change in sleep pattern.     Exam:   BP 112/80   Pulse 74   Resp 16   Ht 4' 10.25" (1.48 m)   Wt 200 lb (90.7  kg)   LMP 11/17/2011   BMI 41.44 kg/m   Body mass index is 41.44 kg/m.  General appearance : Well developed well nourished female. No acute distress HEENT: Eyes: no retinal hemorrhage or exudates,  Neck supple, trachea midline, no carotid bruits, no thyroidmegaly Lungs: Clear to auscultation, no rhonchi or wheezes, or rib retractions  Heart: Regular rate and rhythm, no murmurs or gallops Breast:Examined in sitting and supine position were symmetrical in appearance, no palpable masses or tenderness,  no skin retraction, no nipple inversion, no nipple discharge, no skin discoloration, no axillary or supraclavicular lymphadenopathy Abdomen: no palpable masses or tenderness, no rebound or guarding Extremities: no edema or skin discoloration or tenderness  Pelvic: Vulva: Severe inflammation Left >Right.  2 small ulcers at the left labia majora and labia minora posteriorly.  HSV SureSwab done.             Vagina: No gross lesions.  Vaginal discharge present.  Wet prep done.  Cervix/Uterus absent  Adnexa  Without masses or tenderness  Anus: Normal  Wet prep:  Yeasts present with budding.   Assessment/Plan:  50 y.o. female for annual exam   1. Well female exam with routine gynecological exam Gynecologic exam status post total hysterectomy with vulvar inflammation and ulcers.  No indication for Pap test at this time.  Breast exam normal.  Screening mammogram March 2022 was negative.  Health labs with family physician.  Recommend a screening colonoscopy.  2. S/P total hysterectomy  3. Perimenopause Status post total hysterectomy.  Mild and intermittent menopausal symptoms, will observe at this time  4. Vaginal discharge Yeast vaginitis confirmed by wet prep.  Will treat with fluconazole 1 tablet per mouth daily for 3 days.  Usage reviewed and prescription sent to pharmacy. - WET PREP FOR [place], YEAST, CLUE  5. Vulvar irritation Vulvar irritation possibly secondary to yeast vaginitis.  2  small ulcers were present, will rule out genital herpes.  Clobetasol ointment 0.05% apply thin layer on the affected vulva at bedtime for 2 weeks then twice a week.  Prescription sent to pharmacy.  6. Vulvar ulcer Rule out genital herpes, sure swab HSV done. - SureSwab HSV, Type 1/2 DNA, PCR  7. Class 3 severe obesity due to excess calories without serious comorbidity with body mass index (BMI) of 40.0 to 44.9 in adult [facility]) Recommend a lower calorie/carb diet.  Aerobic activities 5 times a week and light weightlifting every 2 days.  Other orders - escitalopram (LEXAPRO) 10 MG tablet; Take 10 mg by mouth daily. - fluconazole (DIFLUCAN) 150 MG tablet; Take 1 tablet (150 mg total) by mouth daily for 3 days. - clobetasol ointment (TEMOVATE) 0.05 %; Apply 1 application topically at bedtime for 14 days. Thin layer on the affected vulva at bedtime x 2 weeks, then twice a week long term.   Princess Bruins MD, 4:14 PM 08/31/2021

## 2021-09-01 LAB — WET PREP FOR TRICH, YEAST, CLUE

## 2021-09-03 LAB — SURESWAB HSV, TYPE 1/2 DNA, PCR
HSV 1 DNA: NOT DETECTED
HSV 2 DNA: NOT DETECTED

## 2021-09-04 ENCOUNTER — Encounter: Payer: Self-pay | Admitting: Obstetrics & Gynecology

## 2021-10-03 ENCOUNTER — Ambulatory Visit: Payer: 59 | Admitting: Obstetrics & Gynecology

## 2021-10-03 ENCOUNTER — Other Ambulatory Visit: Payer: Self-pay

## 2021-10-03 VITALS — BP 134/80 | Ht <= 58 in | Wt 206.0 lb

## 2021-10-03 DIAGNOSIS — R3915 Urgency of urination: Secondary | ICD-10-CM

## 2021-10-03 DIAGNOSIS — N9089 Other specified noninflammatory disorders of vulva and perineum: Secondary | ICD-10-CM | POA: Diagnosis not present

## 2021-10-03 DIAGNOSIS — N898 Other specified noninflammatory disorders of vagina: Secondary | ICD-10-CM

## 2021-10-03 LAB — URINALYSIS, COMPLETE W/RFL CULTURE
Bacteria, UA: NONE SEEN /HPF
Bilirubin Urine: NEGATIVE
Glucose, UA: NEGATIVE
Hgb urine dipstick: NEGATIVE
Hyaline Cast: NONE SEEN /LPF
Ketones, ur: NEGATIVE
Leukocyte Esterase: NEGATIVE
Nitrites, Initial: NEGATIVE
Protein, ur: NEGATIVE
RBC / HPF: NONE SEEN /HPF (ref 0–2)
Specific Gravity, Urine: 1.025 (ref 1.001–1.035)
WBC, UA: NONE SEEN /HPF (ref 0–5)
pH: 6 (ref 5.0–8.0)

## 2021-10-03 LAB — NO CULTURE INDICATED

## 2021-10-03 LAB — WET PREP FOR TRICH, YEAST, CLUE

## 2021-10-03 MED ORDER — FLUCONAZOLE 150 MG PO TABS: 150.0000 mg | ORAL_TABLET | Freq: Every day | ORAL | 3 refills | Status: AC

## 2021-10-03 NOTE — Progress Notes (Signed)
    Abena Erdman 05-07-1971 [id number]        50 y.o.  G3P3003   RP: F/U Vulvitis  HPI: Much improved irritation at the vulva with Clobetasol.  C/O vaginal itching with d/c.   OB History  Gravida Para Term Preterm AB Living  3 3 3     3   SAB IAB Ectopic Multiple Live Births               # Outcome Date GA Lbr Len/2nd Weight Sex Delivery Anes PTL Lv  3 Term           2 Term           1 Term             Past medical history,surgical history, problem list, medications, allergies, family history and social history were all reviewed and documented in the EPIC chart.   Directed ROS with pertinent positives and negatives documented in the history of present illness/assessment and plan.  Exam:  Vitals:   10/03/21 1628  BP: 134/80  Weight: 206 lb (93.4 kg)  Height: 4\' 10"  (1.473 m)   General appearance:  Normal   Gynecologic exam: Vulva: Normal except for mild erythema at the perineum and left posterior vulva.  Speculum:  Cervix/vagina normal.  Increased d/c.  Wet prep done.  Wet prep:  Yeasts with budding and hyphae U/A Negative   Assessment/Plan:  50 y.o. G3P3003   1. Vulvar irritation Much improved.  Continue Clobetasol ointment at the posterior left vulva.  2. Vaginal discharge Confirmed yeast vaginitis by wet prep.  Treat with Fluconazole. - WET PREP FOR [place], YEAST, CLUE  3. Urinary urgency U/A Negative, reassured. - Urinalysis,Complete w/RFL Culture  Other orders - fluconazole (DIFLUCAN) 150 MG tablet; Take 1 tablet (150 mg total) by mouth daily for 3 days.   Princess Bruins MD, 5:12 PM 10/03/2021

## 2021-10-06 ENCOUNTER — Encounter: Payer: Self-pay | Admitting: Obstetrics & Gynecology

## 2021-11-23 ENCOUNTER — Ambulatory Visit: Payer: 59 | Admitting: Family Medicine

## 2021-12-22 ENCOUNTER — Ambulatory Visit (HOSPITAL_COMMUNITY)
Admission: EM | Admit: 2021-12-22 | Discharge: 2021-12-22 | Disposition: A | Discharge status: Home / Self Care | Payer: 59 | Attending: Internal Medicine | Admitting: Internal Medicine

## 2021-12-22 ENCOUNTER — Encounter (HOSPITAL_COMMUNITY): Payer: Self-pay | Admitting: Emergency Medicine

## 2021-12-22 DIAGNOSIS — M25562 Pain in left knee: Secondary | ICD-10-CM

## 2021-12-22 DIAGNOSIS — M25561 Pain in right knee: Secondary | ICD-10-CM

## 2021-12-22 MED ORDER — METHOCARBAMOL 500 MG PO TABS: 500.0000 mg | ORAL_TABLET | Freq: Every evening | ORAL | 0 refills | Status: DC | PRN

## 2021-12-22 MED ORDER — KETOROLAC TROMETHAMINE 30 MG/ML IJ SOLN
INTRAMUSCULAR | Status: AC
  Filled 2021-12-22: qty 1

## 2021-12-22 MED ORDER — IBUPROFEN 600 MG PO TABS: 600.0000 mg | ORAL_TABLET | Freq: Four times a day (QID) | ORAL | 0 refills | Status: DC | PRN

## 2021-12-22 MED ORDER — KETOROLAC TROMETHAMINE 30 MG/ML IJ SOLN
30.0000 mg | Freq: Once | INTRAMUSCULAR | Status: AC
  Administered 2021-12-22: 30 mg via INTRAMUSCULAR

## 2021-12-22 NOTE — ED Provider Notes (Signed)
[facility]    CSN: [id number] Arrival date & time: 12/22/21  1821      History   Chief Complaint Chief Complaint  Patient presents with   Knee Pain    HPI Lynn Robinson is a 51 y.o. female comes to urgent care with bilateral knee pain of 1 week duration.  Patient describes the pain as 8 out of 10, sharp and associated with some stiffness in the knee joints.  The stiffness is worse when the patient sits for long time and begins to move around.  After some time the knee stiffness and pain improves.  Patient has burning sensation in both lower extremities.  The burning sensation is intermittent.  She denies any polyuria, polydipsia or polyphagia.  No back pain.  No known relieving factors.  No falls or trauma. HPI  Past Medical History:  Diagnosis Date   Anxiety    Fibroids    GERD (gastroesophageal reflux disease)    occasional reflux-no meds   Hypercholesteremia    Seasonal allergies    Shortness of breath    on exertion only    Patient Active Problem List   Diagnosis Date Noted   Yeast vaginitis 08/24/2021   Allergic rhinitis due to pollen 08/24/2021   Hip impingement syndrome, right 06/07/2021   Hip pain, chronic, right 05/20/2021   UTI (urinary tract infection) 10/02/2013   Healthcare maintenance 10/02/2013    Past Surgical History:  Procedure Laterality Date   ABDOMINAL HYSTERECTOMY  12/06/2011   Procedure: HYSTERECTOMY ABDOMINAL;  Surgeon: Mora Bellman, MD;  Location: [facility];  Service: Gynecology;  Laterality: N/A;   CYSTOSCOPY  12/06/2011   Procedure: CYSTOSCOPY;  Surgeon: Mora Bellman, MD;  Location: [facility];  Service: Gynecology;;   TUBAL LIGATION      OB History     Gravida  3   Para  3   Term  3   Preterm      AB      Living  3      SAB      IAB      Ectopic      Multiple      Live Births               Home Medications    Prior to Admission medications   Medication Sig Start Date End Date Taking?  Authorizing Provider  ibuprofen (ADVIL) 600 MG tablet Take 1 tablet (600 mg total) by mouth every 6 (six) hours as needed. 12/22/21  Yes Sundiata Ferrick, Myrene Galas, MD  methocarbamol (ROBAXIN) 500 MG tablet Take 1 tablet (500 mg total) by mouth at bedtime as needed for muscle spasms. 12/22/21  Yes Nomi Rudnicki, Myrene Galas, MD  escitalopram (LEXAPRO) 10 MG tablet Take 10 mg by mouth daily.    [provider]    Family History Family History  Problem Relation Age of Onset   Breast cancer Mother    Breast cancer Sister     Social History Social History   Tobacco Use   Smoking status: Never   Smokeless tobacco: Never  Vaping Use   Vaping Use: Never used  Substance Use Topics   Alcohol use: No   Drug use: No     Allergies   Patient has no known allergies.   Review of Systems Review of Systems  HENT: Negative.    Gastrointestinal: Negative.   Genitourinary: Negative.   Musculoskeletal:  Positive for arthralgias. Negative for back pain, joint swelling, myalgias, neck pain and  neck stiffness.  Neurological: Negative.   Psychiatric/Behavioral: Negative.      Physical Exam Triage Vital Signs ED Triage Vitals  Enc Vitals Group     BP 12/22/21 1903 (!) 149/83     Pulse Rate 12/22/21 1903 84     Resp 12/22/21 1903 19     Temp 12/22/21 1903 97.8 F (36.6 C)     Temp Source 12/22/21 1903 Oral     SpO2 12/22/21 1903 99 %     Weight --      Height --      Head Circumference --      Peak Flow --      Pain Score 12/22/21 1902 6     Pain Loc --      Pain Edu? --      Excl. in [place]? --    No data found.  Updated Vital Signs BP (!) 149/83 (BP Location: Right Arm)    Pulse 84    Temp 97.8 F (36.6 C) (Oral)    Resp 19    LMP 11/17/2011    SpO2 99%   Visual Acuity Right Eye Distance:   Left Eye Distance:   Bilateral Distance:    Right Eye Near:   Left Eye Near:    Bilateral Near:     Physical Exam Vitals and nursing note reviewed.  Constitutional:      General: She is not  in acute distress.    Appearance: She is not ill-appearing.  Cardiovascular:     Rate and Rhythm: Normal rate and regular rhythm.     Pulses: Normal pulses.     Heart sounds: Normal heart sounds.  Pulmonary:     Effort: Pulmonary effort is normal.     Breath sounds: Normal breath sounds.  Musculoskeletal:        General: No swelling, tenderness, deformity or signs of injury. Normal range of motion.  Neurological:     Mental Status: She is alert.     UC Treatments / Results  Labs (all labs ordered are listed, but only abnormal results are displayed) Labs Reviewed - No data to display  EKG   Radiology No results found.  Procedures Procedures (including critical care time)  Medications Ordered in UC Medications  ketorolac (TORADOL) 30 MG/ML injection 30 mg (30 mg Intramuscular Given 12/22/21 1936)    Initial Impression / Assessment and Plan / UC Course  I have reviewed the triage vital signs and the nursing notes.  Pertinent labs & imaging results that were available during my care of the patient were reviewed by me and considered in my medical decision making (see chart for details).     1.  Bilateral knee pain: Ibuprofen as needed for pain Toradol 30 mg IM x1 dose Robaxin at bedtime as needed for muscle spasms or muscle stiffness Return to urgent care if symptoms worsen Heating pad use may help with the leg cramps. Patient is not tachycardic or hypoxic and has no respiratory distress.  Wells score for DVT/PE is low probability. Final Clinical Impressions(s) / UC Diagnoses   Final diagnoses:  Acute pain of both knees     Discharge Instructions      Gentle range of motion exercises Gentle stretches Take medications as prescribed Please take muscle relaxants only at bedtime.  Do not drive or operate heavy machinery after taking muscle relaxants. If symptoms worsen please return to urgent care to be reevaluated.   ED Prescriptions     Medication Sig  Dispense Auth. Provider   ibuprofen (ADVIL) 600 MG tablet Take 1 tablet (600 mg total) by mouth every 6 (six) hours as needed. 30 tablet Cabela Pacifico, Myrene Galas, MD   methocarbamol (ROBAXIN) 500 MG tablet Take 1 tablet (500 mg total) by mouth at bedtime as needed for muscle spasms. 10 tablet Donnis Pecha, Myrene Galas, MD      PDMP not reviewed this encounter.   Chase Picket, MD 12/22/21 2002

## 2021-12-22 NOTE — Discharge Instructions (Addendum)
Gentle range of motion exercises Gentle stretches Take medications as prescribed Please take muscle relaxants only at bedtime.  Do not drive or operate heavy machinery after taking muscle relaxants. If symptoms worsen please return to urgent care to be reevaluated.

## 2021-12-22 NOTE — ED Triage Notes (Signed)
Pt c/o bilat knee pain for week. Reports when working pain was worse. At night felt pain in posterior knee.

## 2022-03-23 ENCOUNTER — Encounter: Payer: Self-pay | Admitting: Family Medicine

## 2022-03-23 ENCOUNTER — Ambulatory Visit (INDEPENDENT_AMBULATORY_CARE_PROVIDER_SITE_OTHER): Payer: 59

## 2022-03-23 ENCOUNTER — Ambulatory Visit (INDEPENDENT_AMBULATORY_CARE_PROVIDER_SITE_OTHER): Payer: 59 | Admitting: Family Medicine

## 2022-03-23 VITALS — BP 130/78 | HR 80 | Temp 97.4°F | Ht <= 58 in | Wt 204.6 lb

## 2022-03-23 DIAGNOSIS — M25552 Pain in left hip: Secondary | ICD-10-CM | POA: Diagnosis not present

## 2022-03-23 DIAGNOSIS — F418 Other specified anxiety disorders: Secondary | ICD-10-CM

## 2022-03-23 DIAGNOSIS — M25562 Pain in left knee: Secondary | ICD-10-CM

## 2022-03-23 DIAGNOSIS — M25561 Pain in right knee: Secondary | ICD-10-CM

## 2022-03-23 DIAGNOSIS — G8929 Other chronic pain: Secondary | ICD-10-CM

## 2022-03-23 DIAGNOSIS — M25551 Pain in right hip: Secondary | ICD-10-CM

## 2022-03-23 MED ORDER — ESCITALOPRAM OXALATE 10 MG PO TABS: 10.0000 mg | ORAL_TABLET | Freq: Every day | ORAL | 1 refills | Status: AC

## 2022-03-23 MED ORDER — MELOXICAM 7.5 MG PO TABS: 7.5000 mg | ORAL_TABLET | Freq: Every day | ORAL | 2 refills | Status: AC

## 2022-03-23 NOTE — Progress Notes (Signed)
? ?Established Patient Office Visit ? ?Subjective   ?Patient ID: Lynn Robinson, female    DOB: 08/18/1971  Age: 51 y.o. MRN: [id number] ? ?Chief Complaint  ?Patient presents with  ? Acute Visit  ?  C/o having shoulder, knee, arm pain x 2 months.  She has taken excedrin and Ibuprofen.   ? ? ?HPI presents complaining of joint pains in her knees hips arms and shoulders.  There is stiffness around these joints.  The muscles hurt as well.  An interpreter was used for the visit.  She indicated pain in her joints around the joints in the muscles and stiffness.  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  She has been taking the Lexapro intermittently for the last 4 years.  She takes it when she thinks she needs it.  And not daily ? ? ? ?Review of Systems  ?Constitutional:  Negative for chills, diaphoresis, malaise/fatigue and weight loss.  ?HENT: Negative.    ?Eyes: Negative.  Negative for blurred vision and double vision.  ?Cardiovascular:  Negative for chest pain.  ?Gastrointestinal:  Negative for abdominal pain.  ?Genitourinary: Negative.   ?Musculoskeletal:  Positive for joint pain. Negative for falls and myalgias.  ?Neurological:  Negative for speech change, loss of consciousness and weakness.  ?Psychiatric/Behavioral:  Positive for depression. The patient is nervous/anxious.   ? ?  03/23/2022  ?  3:51 PM 05/20/2021  ? 10:53 AM 05/20/2021  ? 10:13 AM  ?Depression screen PHQ 2/9  ?Decreased Interest 0 0 0  ?Down, Depressed, Hopeless 0 0 0  ?PHQ - 2 Score 0 0 0  ?Altered sleeping 1    ?Tired, decreased energy 1    ?Change in appetite 1    ?Feeling bad or failure about yourself  0    ?Trouble concentrating 0    ?Moving slowly or fidgety/restless 0    ?Suicidal thoughts 0    ?PHQ-9 Score 3    ?Difficult doing work/chores Not difficult at all    ? ? ? ?  ?Objective:  ?  ? ?BP 130/78   Pulse 80   Temp (!) 97.4 ?F (36.3 ?C) (Temporal)   Ht '4\' 10"'$  (1.473 m)   Wt 204 lb  9.6 oz (92.8 kg)   LMP 11/17/2011   SpO2 99%   BMI 42.76 kg/m?  ? ? ?Physical Exam ?Constitutional:   ?   General: She is not in acute distress. ?   Appearance: Normal appearance. She is not ill-appearing, toxic-appearing or diaphoretic.  ?HENT:  ?   Head: Normocephalic and atraumatic.  ?   Right Ear: External ear normal.  ?   Left Ear: External ear normal.  ?   Mouth/Throat:  ?   Mouth: Mucous membranes are moist.  ?   Pharynx: Oropharynx is clear. No oropharyngeal exudate or posterior oropharyngeal erythema.  ?Eyes:  ?   General: No scleral icterus.    ?   Right eye: No discharge.     ?   Left eye: No discharge.  ?   Extraocular Movements: Extraocular movements intact.  ?   Conjunctiva/sclera: Conjunctivae normal.  ?   Pupils: Pupils are equal, round, and reactive to light.  ?Cardiovascular:  ?   Rate and Rhythm: Normal rate and regular rhythm.  ?Pulmonary:  ?   Effort: Pulmonary effort is normal. No respiratory distress.  ?   Breath sounds: Normal breath sounds.  ?Abdominal:  ?   General:  Bowel sounds are normal.  ?   Tenderness: There is no abdominal tenderness. There is no guarding.  ?Musculoskeletal:     ?   General: No swelling or deformity.  ?   Cervical back: No rigidity or tenderness.  ?   Lumbar back: No bony tenderness. Normal range of motion. Negative right straight leg raise test and negative left straight leg raise test.  ?   Right knee: No swelling, effusion, erythema or bony tenderness. Normal range of motion. Tenderness present over the medial joint line.  ?   Left knee: No swelling, effusion, erythema or bony tenderness. Normal range of motion. Tenderness present over the medial joint line.  ?   Right lower leg: No edema.  ?   Left lower leg: No edema.  ?Skin: ?   General: Skin is warm and dry.  ?Neurological:  ?   Mental Status: She is alert and oriented to person, place, and time.  ?Psychiatric:     ?   Mood and Affect: Mood normal.     ?   Behavior: Behavior normal.  ? ? ? ?No results found  for any visits on 03/23/22. ? ? ? ?The 10-year ASCVD risk score (Arnett DK, et al., 2019) is: 1.7% ? ?  ?Assessment & Plan:  ? ?Problem List Items Addressed This Visit   ? ?  ? Other  ? Pain in both knees - Primary  ? Relevant Medications  ? meloxicam (MOBIC) 7.5 MG tablet  ? Other Relevant Orders  ? DG Knee Complete 4 Views Left  ? DG Knee Complete 4 Views Right  ? ?Other Visit Diagnoses   ? ? Chronic arthralgias of knees and hips      ? Relevant Medications  ? escitalopram (LEXAPRO) 10 MG tablet  ? meloxicam (MOBIC) 7.5 MG tablet  ? Other Relevant Orders  ? Cyclic citrul peptide antibody, IgG (QUEST)  ? Sedimentation rate  ? Rheumatoid Factor  ? DG Knee Complete 4 Views Left  ? DG Knee Complete 4 Views Right  ? Depression with anxiety      ? Relevant Medications  ? escitalopram (LEXAPRO) 10 MG tablet  ? ?  ? ? ?Return in about 2 months (around 05/23/2022).  ? ?Encouraged patient to take the Lexapro daily as prescribed.  We will start low-dose meloxicam.  Ask coming for antishock mat as promised. Patient will continue weight loss efforts.  ?Libby Maw, MD ? ?

## 2022-03-24 LAB — SEDIMENTATION RATE: Sed Rate: 39 mm/hr — ABNORMAL HIGH (ref 0–20)

## 2022-03-25 LAB — CYCLIC CITRUL PEPTIDE ANTIBODY, IGG: Cyclic Citrullin Peptide Ab: <16 UNITS

## 2022-03-25 LAB — RHEUMATOID FACTOR: Rheumatoid fact SerPl-aCnc: <14 [IU]/mL

## 2022-05-23 ENCOUNTER — Ambulatory Visit: Payer: 59 | Admitting: Family Medicine

## 2022-05-23 ENCOUNTER — Telehealth: Payer: Self-pay | Admitting: Family Medicine

## 2022-05-23 NOTE — Telephone Encounter (Signed)
NS letter sent.

## 2022-06-30 ENCOUNTER — Encounter: Payer: Self-pay | Admitting: Family Medicine

## 2022-06-30 NOTE — Telephone Encounter (Signed)
2nd no show, fee generated (no fee for Medicaid), final warning letter sent in [place] - 11/23/21 & 05/23/22 no shows

## 2022-11-23 IMAGING — DX DG HIP (WITH OR WITHOUT PELVIS) 5+V BILAT
5 series · 5 of 5 positions shown · non-contrast
Comparison: None.

CLINICAL DATA: 50-year-old female with right hip pain.

EXAM:
DG HIP (WITH OR WITHOUT PELVIS) 5+V BILAT

[pelvis ap]
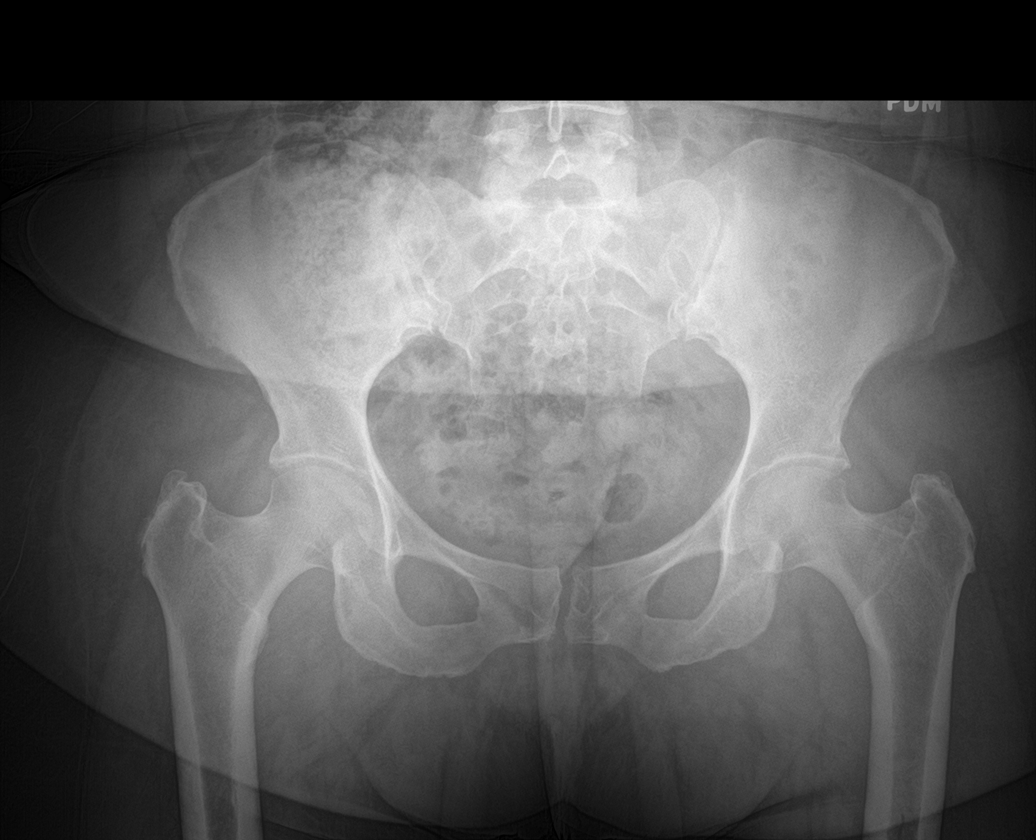

[hip ap (1 of 2)]
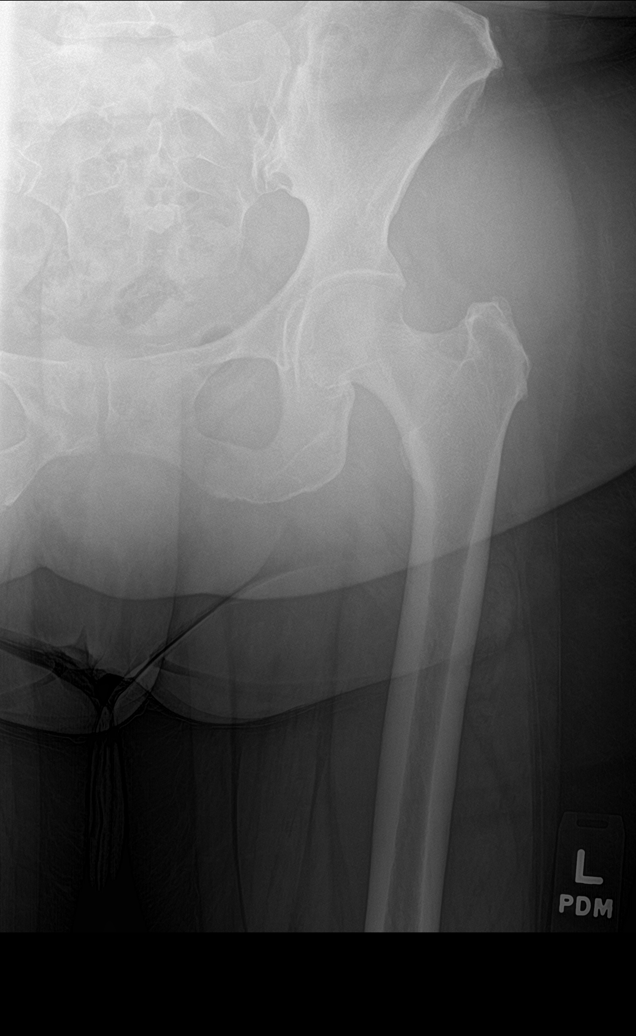

[hip lat (1 of 2)]
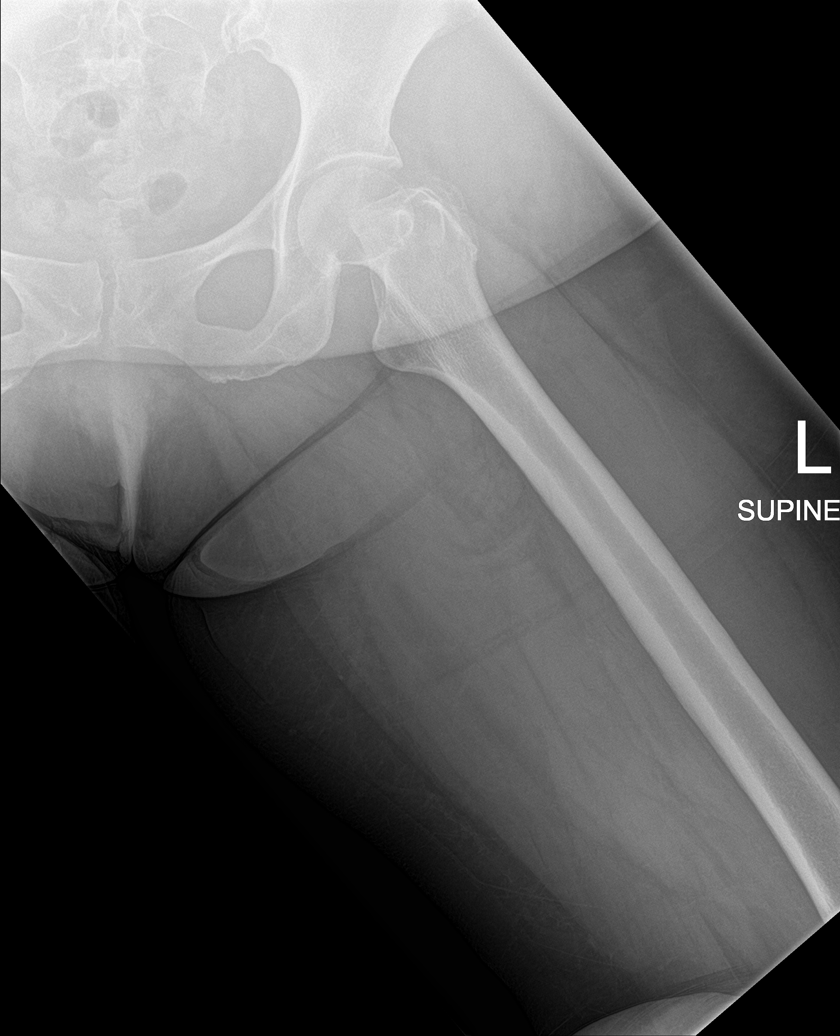

[hip ap (2 of 2)]
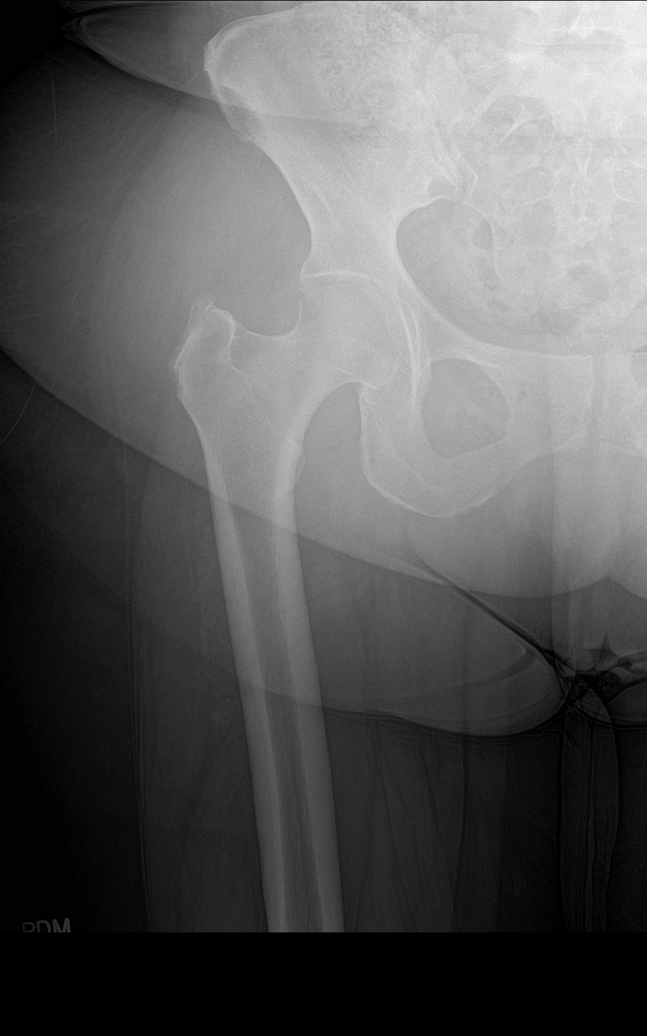

[hip lat (2 of 2)]
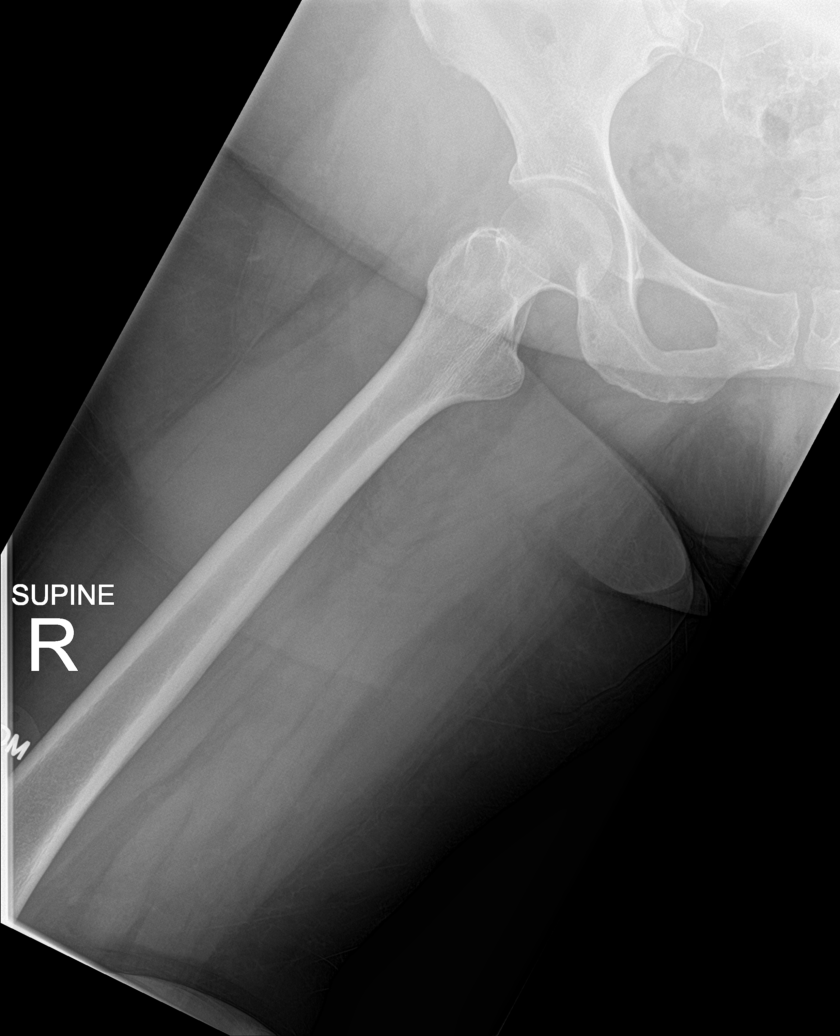

[5 of 5 positions shown; findings below may reference images not displayed]

FINDINGS: There is no evidence of hip fracture or dislocation. There is no
evidence of arthropathy or other focal bone abnormality.
IMPRESSION: Negative.

## 2023-03-13 ENCOUNTER — Encounter: Payer: Self-pay | Admitting: *Deleted

## 2023-07-06 ENCOUNTER — Telehealth: Payer: Self-pay | Admitting: Family Medicine

## 2023-07-06 NOTE — Telephone Encounter (Signed)
Patient has not been seen since 04.2023- called patient to schedule annual physical no answer

## 2024-08-20 ENCOUNTER — Ambulatory Visit (INDEPENDENT_AMBULATORY_CARE_PROVIDER_SITE_OTHER): Payer: Self-pay | Admitting: Nurse Practitioner

## 2024-08-20 VITALS — BP 124/76 | Wt 188.0 lb

## 2024-08-20 DIAGNOSIS — Z01419 Encounter for gynecological examination (general) (routine) without abnormal findings: Secondary | ICD-10-CM

## 2024-08-20 NOTE — Progress Notes (Signed)
   Acute Office Visit  Subjective:    Patient ID: Lynn Robinson, female    DOB: [birth year], 53 y.o.   MRN: [id number]   HPI 53 y.o. G3P3003 presents today for vaginal bulge. Noticed it 2 weeks ago. Worse with lifting, carrying things. Denies urinary or GI symptoms. No pain. Notices pulsations sometimes. No other vaginal symptoms. S/P 2013 hysterectomy for fibroids. Had 3 vaginal deliveries. First and third babies were large. Audio interpreter services used. Derm managing lichen sclerosus.   Patient's last menstrual period was 11/17/2011.    Review of Systems  Constitutional: Negative.   Gastrointestinal: Negative.   Genitourinary:  Negative for difficulty urinating, dysuria, flank pain, frequency, genital sores, hematuria, pelvic pain, urgency and vaginal pain.       Objective:    Physical Exam Constitutional:      Appearance: Normal appearance.  Genitourinary:    General: Normal vulva.     Vagina: Normal.     Uterus: Absent.      Comments: No evidence of bladder, vaginal wall or rectal prolapse, no cysts or abnormal areas seen    BP 124/76 (BP Location: Left Arm, Patient Position: Sitting, Cuff Size: Large)   Wt 188 lb (85.3 kg)   LMP 11/17/2011   BMI 39.29 kg/m  Wt Readings from Last 3 Encounters:  08/20/24 188 lb (85.3 kg)  03/23/22 204 lb 9.6 oz (92.8 kg)  10/03/21 206 lb (93.4 kg)        Patient informed chaperone available to be present for breast and pelvic exam. Patient has requested no chaperone to be present. Patient has been advised what will be completed during breast and pelvic exam.   Assessment & Plan:   Problem List Items Addressed This Visit   None Visit Diagnoses       Normal vaginal exam    -  Primary      Plan: Reassurance on normal vaginal exam today. No evidence of bladder, vaginal wall or rectal prolapse. Will monitor.   Return if symptoms worsen or fail to improve.    Annabella DELENA Shutter DNP, 1:59 PM 08/20/2024

## 2024-08-27 ENCOUNTER — Ambulatory Visit: Payer: Self-pay | Admitting: Nurse Practitioner
